# Patient Record
Sex: Female | Born: 1985 | Race: Black or African American | Hispanic: No | Marital: Single | Smoking: Current every day smoker
Health system: Southern US, Community
[De-identification: ages and names within clinical notes are randomized; demographics above are authoritative.]

## PROBLEM LIST (undated history)

## (undated) DIAGNOSIS — R569 Unspecified convulsions: Secondary | ICD-10-CM

## (undated) HISTORY — PX: EXTERNAL EAR SURGERY: SHX627

---

## 2007-09-28 ENCOUNTER — Emergency Department: Payer: Self-pay | Admitting: Emergency Medicine

## 2009-11-09 ENCOUNTER — Emergency Department: Payer: Self-pay | Admitting: Emergency Medicine

## 2012-04-10 ENCOUNTER — Emergency Department: Payer: Self-pay | Admitting: Emergency Medicine

## 2012-04-10 LAB — CBC
HGB: 14.8 g/dL (ref 12.0–16.0)
MCH: 30 pg (ref 26.0–34.0)
MCHC: 34.7 g/dL (ref 32.0–36.0)
MCV: 87 fL (ref 80–100)
Platelet: 259 10*3/uL (ref 150–440)
RBC: 4.93 10*6/uL (ref 3.80–5.20)

## 2012-04-10 LAB — COMPREHENSIVE METABOLIC PANEL
BUN: 10 mg/dL (ref 7–18)
Bilirubin,Total: 0.2 mg/dL (ref 0.2–1.0)
Calcium, Total: 8.6 mg/dL (ref 8.5–10.1)
Chloride: 110 mmol/L — ABNORMAL HIGH (ref 98–107)
Creatinine: 0.74 mg/dL (ref 0.60–1.30)
EGFR (African American): >60
EGFR (Non-African Amer.): >60
SGOT(AST): 17 U/L (ref 15–37)
SGPT (ALT): 18 U/L (ref 12–78)
Total Protein: 7.4 g/dL (ref 6.4–8.2)

## 2012-04-12 LAB — BETA STREP CULTURE(ARMC)

## 2012-11-27 ENCOUNTER — Emergency Department: Payer: Self-pay | Admitting: Emergency Medicine

## 2012-11-27 LAB — COMPREHENSIVE METABOLIC PANEL
Alkaline Phosphatase: 66 U/L (ref 50–136)
Anion Gap: 3 — ABNORMAL LOW (ref 7–16)
BUN: 6 mg/dL — ABNORMAL LOW (ref 7–18)
Bilirubin,Total: 0.2 mg/dL (ref 0.2–1.0)
Co2: 29 mmol/L (ref 21–32)
EGFR (African American): >60
Glucose: 104 mg/dL — ABNORMAL HIGH (ref 65–99)
Osmolality: 274 (ref 275–301)
SGOT(AST): 28 U/L (ref 15–37)
SGPT (ALT): 19 U/L (ref 12–78)
Sodium: 138 mmol/L (ref 136–145)
Total Protein: 8.3 g/dL — ABNORMAL HIGH (ref 6.4–8.2)

## 2012-11-27 LAB — ETHANOL: Ethanol %: 0.143 % — ABNORMAL HIGH (ref 0.000–0.080)

## 2012-11-27 LAB — CBC
HCT: 44.1 % (ref 35.0–47.0)
HGB: 15.1 g/dL (ref 12.0–16.0)
MCHC: 34.3 g/dL (ref 32.0–36.0)
MCV: 87 fL (ref 80–100)
Platelet: 239 10*3/uL (ref 150–440)
RDW: 13.1 % (ref 11.5–14.5)

## 2013-08-14 ENCOUNTER — Emergency Department: Payer: Self-pay | Admitting: Emergency Medicine

## 2014-02-28 ENCOUNTER — Emergency Department: Payer: Self-pay | Admitting: Emergency Medicine

## 2014-08-09 ENCOUNTER — Emergency Department: Payer: Self-pay | Admitting: Emergency Medicine

## 2015-07-25 ENCOUNTER — Emergency Department
Admission: EM | Admit: 2015-07-25 | Discharge: 2015-07-25 | Disposition: A | Discharge status: Home / Self Care | Payer: Self-pay | Attending: Emergency Medicine | Admitting: Emergency Medicine

## 2015-07-25 ENCOUNTER — Emergency Department: Payer: Self-pay

## 2015-07-25 ENCOUNTER — Encounter: Payer: Self-pay | Admitting: Emergency Medicine

## 2015-07-25 DIAGNOSIS — R569 Unspecified convulsions: Secondary | ICD-10-CM | POA: Insufficient documentation

## 2015-07-25 DIAGNOSIS — Z3202 Encounter for pregnancy test, result negative: Secondary | ICD-10-CM | POA: Insufficient documentation

## 2015-07-25 DIAGNOSIS — K6289 Other specified diseases of anus and rectum: Secondary | ICD-10-CM | POA: Insufficient documentation

## 2015-07-25 DIAGNOSIS — F1410 Cocaine abuse, uncomplicated: Secondary | ICD-10-CM | POA: Insufficient documentation

## 2015-07-25 DIAGNOSIS — F17210 Nicotine dependence, cigarettes, uncomplicated: Secondary | ICD-10-CM | POA: Insufficient documentation

## 2015-07-25 DIAGNOSIS — F1210 Cannabis abuse, uncomplicated: Secondary | ICD-10-CM | POA: Insufficient documentation

## 2015-07-25 HISTORY — DX: Unspecified convulsions: R56.9

## 2015-07-25 LAB — URINE DRUG SCREEN, QUALITATIVE (ARMC ONLY)
Amphetamines, Ur Screen: NOT DETECTED
BARBITURATES, UR SCREEN: NOT DETECTED
Benzodiazepine, Ur Scrn: NOT DETECTED
CANNABINOID 50 NG, UR ~~LOC~~: POSITIVE — AB
COCAINE METABOLITE, UR ~~LOC~~: POSITIVE — AB
MDMA (Ecstasy)Ur Screen: NOT DETECTED
Methadone Scn, Ur: NOT DETECTED
OPIATE, UR SCREEN: NOT DETECTED
Phencyclidine (PCP) Ur S: NOT DETECTED
TRICYCLIC, UR SCREEN: NOT DETECTED

## 2015-07-25 LAB — CK: Total CK: 208 U/L (ref 38–234)

## 2015-07-25 LAB — URINALYSIS COMPLETE WITH MICROSCOPIC (ARMC ONLY)
BACTERIA UA: NONE SEEN
Bilirubin Urine: NEGATIVE
Glucose, UA: NEGATIVE mg/dL
Hgb urine dipstick: NEGATIVE
Leukocytes, UA: NEGATIVE
Nitrite: NEGATIVE
PH: 7 (ref 5.0–8.0)
PROTEIN: NEGATIVE mg/dL
Specific Gravity, Urine: 1.013 (ref 1.005–1.030)

## 2015-07-25 LAB — COMPREHENSIVE METABOLIC PANEL
ALT: 16 U/L (ref 14–54)
AST: 20 U/L (ref 15–41)
Albumin: 4.9 g/dL (ref 3.5–5.0)
Alkaline Phosphatase: 45 U/L (ref 38–126)
Anion gap: 9 (ref 5–15)
BILIRUBIN TOTAL: 0.4 mg/dL (ref 0.3–1.2)
BUN: 7 mg/dL (ref 6–20)
CO2: 23 mmol/L (ref 22–32)
CREATININE: 0.64 mg/dL (ref 0.44–1.00)
Calcium: 8.7 mg/dL — ABNORMAL LOW (ref 8.9–10.3)
Chloride: 108 mmol/L (ref 101–111)
GLUCOSE: 90 mg/dL (ref 65–99)
POTASSIUM: 3.6 mmol/L (ref 3.5–5.1)
Sodium: 140 mmol/L (ref 135–145)
TOTAL PROTEIN: 7.8 g/dL (ref 6.5–8.1)

## 2015-07-25 LAB — CBC
HEMATOCRIT: 44.5 % (ref 35.0–47.0)
Hemoglobin: 14.9 g/dL (ref 12.0–16.0)
MCH: 29.3 pg (ref 26.0–34.0)
MCHC: 33.5 g/dL (ref 32.0–36.0)
MCV: 87.5 fL (ref 80.0–100.0)
PLATELETS: 215 10*3/uL (ref 150–440)
RBC: 5.09 MIL/uL (ref 3.80–5.20)
RDW: 13.8 % (ref 11.5–14.5)
WBC: 8.9 10*3/uL (ref 3.6–11.0)

## 2015-07-25 LAB — POCT PREGNANCY, URINE: PREG TEST UR: NEGATIVE

## 2015-07-25 LAB — ETHANOL: Alcohol, Ethyl (B): 152 mg/dL — ABNORMAL HIGH (ref <5)

## 2015-07-25 MED ORDER — SODIUM CHLORIDE 0.9 % IV SOLN
1000.0000 mg | Freq: Once | INTRAVENOUS | Status: AC
  Administered 2015-07-25: 1000 mg via INTRAVENOUS
  Filled 2015-07-25 (×2): qty 10

## 2015-07-25 NOTE — ED Notes (Addendum)
Pt presents to ED via EMS after she had reportedly had seizure like activity while at home with her girlfriend and again witnessed by EMS. Pt was said to have smoked marijuana and drank brandy this evening. Seizure lasted unknown amount of time and pt denies any pain or injury. Pt denies incontinence during this time. Pt not currently answering questions verbally and is only raising her hand to give a thumbs up.  Pt currently has no increased work of breathing or acute distress noted at this time. Hx of the same approx a year ago.

## 2015-07-25 NOTE — ED Notes (Signed)
Pt reports she had gone to the bathroom to use the restroom right before her seizure like activity occured. Pt states her rectum has been very painful this evening. Denies rectal bleeding, hemorrhoid, or abscess to the affected area. MD visualized rectum during exam and was unable to find any obvious abnormalities.

## 2015-07-25 NOTE — ED Notes (Signed)
Dr. Webster at the bedside for pt evaluation 

## 2015-07-25 NOTE — ED Provider Notes (Signed)
-----------------------------------------   10:43 AM on 07/25/2015 -----------------------------------------  Patient's imaging of come back showing normal results. Patient's labs show an elevated alcohol level as well as a urine positive for cocaine and cannabinoids. Patient has had no further seizure activity in the emergency department. At this time with a recent substance abuse I do not feel she will require long-term antiepileptic medication urgently/emergently, however the patient will need follow-up with neurology for further evaluation and hopefully an EEG. I discussed this plan of care with the patient and she is agreeable. We will discharge home with neurology follow-up.  Harvest Dark, MD 07/25/15 1044

## 2015-07-25 NOTE — ED Provider Notes (Signed)
[facility] Emergency Department Provider Note  ____________________________________________  Time seen: Approximately 546 AM  I have reviewed the triage vital signs and the nursing notes.   HISTORY  Chief Complaint Seizures  The patient is uncooperative and not answering questions fully.  HPI Kendra Potts is a 29 y.o. female who comes into the hospital with the seizure. The patient has had a seizure 1 year ago. According to EMS the patient was drinking brandy and smoking weed last night at a girlfriend's house. The patient then had a seizure. The patient reports that she does not remember what happened she was just told that she had a seizure. Per EMS it appears as though the seizure lasted 15-20 seconds and the patient had some mild shaking of her hands in her feet. The patient reports the last thing she remembers is seeing strange people but then she reported that the strange people with the EMS workers. She is homeless and reports that she was at a friend's house. She reports that she had used the bathroom earlier and is having some pain to her bottom. She reports that when the ambulance arrived she blacked out but couldn't tell me what happened prior. She reports that she is drowsy.The patient rates her pain 8 out of 10 in intensity at this time and her bottom. She does not have any headaches and does not remember hitting her head. She did have a seizure 1 year ago but states I don't know when asked about the seizure.   Past Medical History  Diagnosis Date  . Seizure ([place])     There are no active problems to display for this patient.   History reviewed. No pertinent past surgical history.  No current outpatient prescriptions on file.  Allergies Review of patient's allergies indicates no known allergies.  No family history on file.  Social History Social History  Substance Use Topics  . Smoking status: Current Every Day Smoker    Types:  Cigarettes  . Smokeless tobacco: None  . Alcohol Use: Yes    Review of Systems Constitutional: No fever/chills Eyes: No visual changes. ENT: No sore throat. Cardiovascular: Denies chest pain. Respiratory: Denies shortness of breath. Gastrointestinal: No abdominal pain.  No nausea, no vomiting.  No diarrhea.  No constipation. Genitourinary: Rectal pain, Negative for dysuria. Musculoskeletal: Negative for back pain. Skin: Negative for rash. Neurological: Seizure  10-point ROS otherwise negative.  ____________________________________________   PHYSICAL EXAM:  VITAL SIGNS: ED Triage Vitals  Enc Vitals Group     BP 07/25/15 0555 117/78 mmHg     Pulse Rate 07/25/15 0555 78     Resp 07/25/15 0555 26     Temp 07/25/15 0555 98.3 F (36.8 C)     Temp Source 07/25/15 0555 Oral     SpO2 07/25/15 0555 100 %     Weight 07/25/15 0555 125 lb (56.7 kg)     Height 07/25/15 0555 5\' 2"  (1.575 m)     Head Cir --      Peak Flow --      Pain Score 07/25/15 0607 10     Pain Loc --      Pain Edu? --      Excl. in [place]? --     Constitutional: Alert and oriented. Disheveled appearing and in mild distress. Eyes: Conjunctivae are normal. PERRL. EOMI. Head: Atraumatic. Nose: No congestion/rhinnorhea. Mouth/Throat: Mucous membranes are moist.  Oropharynx non-erythematous. Cardiovascular: Normal rate, regular rhythm. Grossly normal heart sounds.  Good peripheral circulation. Respiratory: Normal respiratory effort.  No retractions. Clear to auscultation bilaterally Gastrointestinal: Soft and nontender. No distention. Positive bowel sounds Rectal: Patient with severe pain touching her gluteus maximus. I was able to look at the patient's rectum and she did not have any appearing tears no bleeding no soft tissue swelling with a concern for abscess. The patient will not allow me to touch her rectum. Musculoskeletal: No lower extremity tenderness nor edema.  No joint effusions. Neurologic:  Normal  speech and language. No gross focal neurologic deficits are appreciated. Cranial nerves II through XII are grossly intact Skin:  Skin is warm, dry and intact.  Psychiatric: Mood and affect are normal.   ____________________________________________   LABS (all labs ordered are listed, but only abnormal results are displayed)  Labs Reviewed  COMPREHENSIVE METABOLIC PANEL - Abnormal; Notable for the following:    Calcium 8.7 (*)    All other components within normal limits  CBC  CK  ETHANOL  URINE DRUG SCREEN, QUALITATIVE ([place])   ____________________________________________  EKG  ED ECG REPORT I, Loney Hering, the attending physician, personally viewed and interpreted this ECG.   Date: 07/25/2015  EKG Time: 554  Rate: 87  Rhythm: normal sinus rhythm  Axis: none  Intervals:none  ST&T Change: none  ____________________________________________  RADIOLOGY  CT head: unremarkable non contrast head CT ____________________________________________   PROCEDURES  Procedure(s) performed: None  Critical Care performed: No  ____________________________________________   INITIAL IMPRESSION / ASSESSMENT AND PLAN / ED COURSE  Pertinent labs & imaging results that were available during my care of the patient were reviewed by me and considered in my medical decision making (see chart for details).  This is a 29 year old female who comes in today with a seizure at home. The patient reports that she had a seizure a year ago but did not give me much information. We'll check some blood work a CT head in the pelvis and reassess the patient.  ----------------------------------------- 7:17 AM on 07/25/2015 -----------------------------------------  The patient's care was signed out to Dr.Paduchowski who will follow up the results of the patient's blood work. The patient will receive a dose of Keppra as well for seizure prophylaxis.   ____________________________________________   FINAL CLINICAL IMPRESSION(S) / ED DIAGNOSES  Final diagnoses:  Seizure ([place])      Loney Hering, MD 07/25/15 [phone]

## 2015-07-25 NOTE — Discharge Instructions (Signed)

## 2016-02-10 ENCOUNTER — Encounter: Payer: Self-pay | Admitting: Emergency Medicine

## 2016-02-10 ENCOUNTER — Emergency Department
Admission: EM | Admit: 2016-02-10 | Discharge: 2016-02-10 | Disposition: A | Discharge status: Home / Self Care | Payer: Self-pay | Attending: Emergency Medicine | Admitting: Emergency Medicine

## 2016-02-10 DIAGNOSIS — F17210 Nicotine dependence, cigarettes, uncomplicated: Secondary | ICD-10-CM | POA: Insufficient documentation

## 2016-02-10 DIAGNOSIS — Z8669 Personal history of other diseases of the nervous system and sense organs: Secondary | ICD-10-CM | POA: Insufficient documentation

## 2016-02-10 DIAGNOSIS — H9202 Otalgia, left ear: Secondary | ICD-10-CM

## 2016-02-10 DIAGNOSIS — H6123 Impacted cerumen, bilateral: Secondary | ICD-10-CM | POA: Insufficient documentation

## 2016-02-10 MED ORDER — HYDROCODONE-ACETAMINOPHEN 5-325 MG PO TABS: 1.0000 | ORAL_TABLET | ORAL | Status: DC | PRN

## 2016-02-10 MED ORDER — HYDROCODONE-ACETAMINOPHEN 5-325 MG PO TABS
1.0000 | ORAL_TABLET | Freq: Once | ORAL | Status: AC
  Administered 2016-02-10: 1 via ORAL
  Filled 2016-02-10: qty 1

## 2016-02-10 MED ORDER — NEOMYCIN-POLYMYXIN-HC 3.5-10000-1 OT SOLN: 3.0000 [drp] | Freq: Four times a day (QID) | OTIC | Status: AC

## 2016-02-10 MED ORDER — AMOXICILLIN 500 MG PO CAPS: 500.0000 mg | ORAL_CAPSULE | Freq: Three times a day (TID) | ORAL | Status: DC

## 2016-02-10 NOTE — ED Notes (Signed)
States she developed pain to left ear about 3 days ago  Unsure of fever but has had chills  States pain is to entire left side of face and into teeth.the patient is very tearful on arrival to ED

## 2016-02-10 NOTE — ED Provider Notes (Signed)
[facility] Emergency Department Provider Note  ____________________________________________  Time seen: Approximately 12:14 PM  I have reviewed the triage vital signs and the nursing notes.   HISTORY  Chief Complaint Otalgia   HPI Kendra Potts is a 30 y.o. female is here with complaint of left ear pain for 3 days. Patient is unaware of any fever or chills. 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 Patient states that at one time she was given drops to place in her ear canals to remove wax however her mother died last year and she does not know what medication or where the papers for that medication are. Patient has not seen an ear specialist since moving here. Currently she is tearful and rates her pain as a 10 over 10.   Past Medical History  Diagnosis Date  . Seizure ([place])     There are no active problems to display for this patient.   History reviewed. No pertinent past surgical history.  Current Outpatient Rx  Name  Route  Sig  Dispense  Refill  . amoxicillin (AMOXIL) 500 MG capsule   Oral   Take 1 capsule (500 mg total) by mouth 3 (three) times daily.   30 capsule   0   . HYDROcodone-acetaminophen (NORCO/VICODIN) 5-325 MG tablet   Oral   Take 1 tablet by mouth every 4 (four) hours as needed for moderate pain.   20 tablet   0   . neomycin-polymyxin-hydrocortisone (CORTISPORIN) otic solution   Left Ear   Place 3 drops into the left ear 4 (four) times daily.   10 mL   0     Allergies Review of patient's allergies indicates no known allergies.  No family history on file.  Social History Social History  Substance Use Topics  . Smoking status: Current Every Day Smoker    Types: Cigarettes  . Smokeless tobacco: None  . Alcohol Use: Yes    Review of Systems Constitutional:  No fever/chills Eyes: No visual changes. ENT: No sore throat.Positive left ear pain. Cardiovascular: Denies chest pain. Respiratory: Denies shortness of breath. Gastrointestinal: No abdominal pain.  No nausea, no vomiting.   Skin: Negative for rash. Neurological: Negative for headaches, focal weakness or numbness.  10-point ROS otherwise negative.  ____________________________________________   PHYSICAL EXAM:  VITAL SIGNS: ED Triage Vitals  Enc Vitals Group     BP 02/10/16 1138 150/96 mmHg     Pulse Rate 02/10/16 1138 67     Resp 02/10/16 1138 20     Temp 02/10/16 1138 98.2 F (36.8 C)     Temp Source 02/10/16 1138 Oral     SpO2 02/10/16 1138 100 %     Weight 02/10/16 1138 125 lb (56.7 kg)     Height 02/10/16 1138 5\' 2"  (1.575 m)     Head Cir --      Peak Flow --      Pain Score 02/10/16 1140 10     Pain Loc --      Pain Edu? --      Excl. in [place]? --     Constitutional: Alert and oriented. Well appearing and in no acute distress. Eyes: Conjunctivae are normal. PERRL. EOMI. Head: Atraumatic. Nose: No congestion/rhinnorhea.  Right EAC is moderately occluded with cerumen and visualization of the TM is limited. On examination  of the left ear there is some chronic deformity externally. Examination of the now it is difficult secondary to patient's pain and canal being tortuous. There is moderate amount of cerumen present along with tenderness. TM is not completely clearly  visible. Mouth/Throat: Mucous membranes are moist.  Oropharynx non-erythematous. Neck: No stridor.   Hematological/Lymphatic/Immunilogical: No cervical lymphadenopathy. Cardiovascular: Normal rate, regular rhythm. Grossly normal heart sounds.  Good peripheral circulation. Respiratory: Normal respiratory effort.  No retractions. Lungs CTAB. Musculoskeletal: Moves upper and lower extremities without any difficulty and normal gait was noted. Neurologic:  Normal speech and language. No gross focal neurologic  deficits are appreciated.  Skin:  Skin is warm, dry and intact. No rash noted. Psychiatric: Mood and affect are normal. Speech and behavior are normal.  ____________________________________________   LABS (all labs ordered are listed, but only abnormal results are displayed)  Labs Reviewed - No data to display  PROCEDURES  Procedure(s) performed: None  Procedures  Critical Care performed: No  ____________________________________________   INITIAL IMPRESSION / ASSESSMENT AND PLAN / ED COURSE  Pertinent labs & imaging results that were available during my care of the patient were reviewed by me and considered in my medical decision making (see chart for details).  Cerumen was not removed secondary to patient's pain intolerance and crying. Patient is familiar with using eardrops to remove the wax therefore she is to pick up some Debrox at the pharmacy. Patient was given a prescription for amoxicillin 500 mg 3 times a day for 10 days, Norco as needed for pain, Cortisporin otic solution to her left ear. Patient is to follow-up with Dr. Kathyrn Sheriff for any continued problems with her ear. ____________________________________________   FINAL CLINICAL IMPRESSION(S) / ED DIAGNOSES  Final diagnoses:  Acute ear pain, left  Excessive cerumen in ear canal, bilateral      NEW MEDICATIONS STARTED DURING THIS VISIT:  Discharge Medication List as of 02/10/2016 12:33 PM    START taking these medications   Details  amoxicillin (AMOXIL) 500 MG capsule Take 1 capsule (500 mg total) by mouth 3 (three) times daily., Starting 02/10/2016, Until Discontinued, Print    HYDROcodone-acetaminophen (NORCO/VICODIN) 5-325 MG tablet Take 1 tablet by mouth every 4 (four) hours as needed for moderate pain., Starting 02/10/2016, Until Discontinued, Print    neomycin-polymyxin-hydrocortisone (CORTISPORIN) otic solution Place 3 drops into the left ear 4 (four) times daily., Starting 02/10/2016, Until Wed 02/20/16,  Print         Note:  This document was prepared using Dragon voice recognition software and may include unintentional dictation errors.    Johnn Hai, PA-C 02/10/16 [place], MD 02/10/16 2055

## 2016-02-10 NOTE — ED Notes (Signed)
Pt report left ear pain for past 3 days denies fever

## 2016-02-10 NOTE — Discharge Instructions (Signed)
Begin taking Norco as needed for pain. Amoxicillin 500 mg 3 times a day for 10 days. Cortisporin otic suspension to the left ear as directed. Follow-up with Dr. Kathyrn Sheriff if any continued problems. You will need to call for an appointment. Also obtain Debrox ear wax removal drops to apply to both your ears as directed.  This medication is over-the-counter.

## 2018-02-14 ENCOUNTER — Other Ambulatory Visit: Payer: Self-pay

## 2018-02-14 ENCOUNTER — Emergency Department (HOSPITAL_COMMUNITY)
Admission: EM | Admit: 2018-02-14 | Discharge: 2018-02-15 | Disposition: A | Discharge status: Home / Self Care | Payer: Self-pay | Attending: Emergency Medicine | Admitting: Emergency Medicine

## 2018-02-14 ENCOUNTER — Encounter (HOSPITAL_COMMUNITY): Payer: Self-pay

## 2018-02-14 DIAGNOSIS — Z79899 Other long term (current) drug therapy: Secondary | ICD-10-CM | POA: Insufficient documentation

## 2018-02-14 DIAGNOSIS — M545 Low back pain: Secondary | ICD-10-CM | POA: Insufficient documentation

## 2018-02-14 DIAGNOSIS — F17210 Nicotine dependence, cigarettes, uncomplicated: Secondary | ICD-10-CM | POA: Insufficient documentation

## 2018-02-14 MED ORDER — LIDOCAINE 5 % EX PTCH
1.0000 | MEDICATED_PATCH | CUTANEOUS | Status: DC
  Administered 2018-02-14: 1 via TRANSDERMAL
  Filled 2018-02-14: qty 1

## 2018-02-14 MED ORDER — DEXAMETHASONE SODIUM PHOSPHATE 10 MG/ML IJ SOLN
10.0000 mg | Freq: Once | INTRAMUSCULAR | Status: AC
  Administered 2018-02-14: 10 mg via INTRAMUSCULAR
  Filled 2018-02-14: qty 1

## 2018-02-14 MED ORDER — OXYCODONE-ACETAMINOPHEN 5-325 MG PO TABS
2.0000 | ORAL_TABLET | Freq: Once | ORAL | Status: AC
  Administered 2018-02-14: 2 via ORAL
  Filled 2018-02-14: qty 2

## 2018-02-14 MED ORDER — METHOCARBAMOL 500 MG PO TABS
500.0000 mg | ORAL_TABLET | Freq: Once | ORAL | Status: AC
  Administered 2018-02-14: 500 mg via ORAL
  Filled 2018-02-14: qty 1

## 2018-02-14 NOTE — ED Triage Notes (Signed)
Pt reports a R lower back spasm that started around 2p today, Pt states that she was "horseplaying" when it started. Denies urinary symptoms or N/V/D.   Pt also reports that she got some Popeye's seasoning up her nose 2 weeks ago and wants her nose evaluated.    A&Ox4. Ambulatory, but slow and stiiff.

## 2018-02-14 NOTE — ED Provider Notes (Signed)
[facility] DEPT Provider Note   CSN: [id number] Arrival date & time: 02/14/18  2212     History   Chief Complaint Chief Complaint  Patient presents with  . Back Pain    spasms    HPI Kendra Potts is a 32 y.o. female.  32 year old female with a history of chronic back pain presents to the emergency department for evaluation of acute exacerbation of back pain.  She states that she was "horse playing" at 1400 today when she twisted and felt a "pop" in her low back followed by onset of worsening pain.  She states that pain is aggravated with movement as well as ambulation.  She took 1200 mg ibuprofen prior to arrival.  This provided no relief of her discomfort.  Denies any bowel or bladder incontinence, genital numbness, lower extremity numbness or paresthesias, extremity weakness.  Denies any history of chronic steroid use or cancer.  No recent fevers.  Patient also secondary complaint of nasal irritation after inhaling some Popeyes seasoning 2 weeks ago.     Past Medical History:  Diagnosis Date  . Seizure ([place])     There are no active problems to display for this patient.   History reviewed. No pertinent surgical history.   OB History   None      Home Medications    Prior to Admission medications   Medication Sig Start Date End Date Taking? Authorizing Provider  amoxicillin (AMOXIL) 500 MG capsule Take 1 capsule (500 mg total) by mouth 3 (three) times daily. 02/10/16   Johnn Hai, PA-C  HYDROcodone-acetaminophen (NORCO/VICODIN) 5-325 MG tablet Take 1-2 tablets by mouth every 6 (six) hours as needed for moderate pain or severe pain. 02/15/18   Antonietta Breach, PA-C  methocarbamol (ROBAXIN) 500 MG tablet Take 1 tablet (500 mg total) by mouth 2 (two) times daily. 02/15/18   Antonietta Breach, PA-C  naproxen (NAPROSYN) 500 MG tablet Take 1 tablet (500 mg total) by mouth 2 (two) times daily. 02/15/18   Antonietta Breach, PA-C    Family  History History reviewed. No pertinent family history.  Social History Social History   Tobacco Use  . Smoking status: Current Every Day Smoker    Types: Cigarettes  Substance Use Topics  . Alcohol use: Yes  . Drug use: No    Types: Marijuana     Allergies   Patient has no known allergies.   Review of Systems Review of Systems Ten systems reviewed and are negative for acute change, except as noted in the HPI.    Physical Exam Updated Vital Signs BP (!) 143/85 (BP Location: Left Arm)   Pulse 90   Temp 98.8 F (37.1 C) (Oral)   Resp 16   SpO2 100%   Physical Exam  Constitutional: She is oriented to person, place, and time. She appears well-developed and well-nourished. No distress.  Nontoxic appearing and in NAD  HENT:  Head: Normocephalic and atraumatic.  Bilateral nares patent. No septal deviation or hematoma.  Eyes: Conjunctivae and EOM are normal. No scleral icterus.  Neck: Normal range of motion.  Cardiovascular: Normal rate, regular rhythm and intact distal pulses.  DP pulse 2+ bilaterally.  Pulmonary/Chest: Effort normal. No stridor. No respiratory distress.  Respirations even and unlabored  Musculoskeletal: Normal range of motion.  Tenderness to palpation across the low back.  No bony deformities, step-offs, crepitus to the thoracic or lumbosacral midline.  Neurological: She is alert and oriented to person, place, and time. She  exhibits normal muscle tone. Coordination normal.  Ambulatory with steady, antalgic gait.  Sensation to light touch intact in bilateral lower extremities.  Skin: Skin is warm and dry. No rash noted. She is not diaphoretic. No erythema. No pallor.  Psychiatric: She has a normal mood and affect. Her behavior is normal.  Nursing note and vitals reviewed.    ED Treatments / Results  Labs (all labs ordered are listed, but only abnormal results are displayed) Labs Reviewed - No data to display  EKG None  Radiology No results  found.  Procedures Procedures (including critical care time)  Medications Ordered in ED Medications  lidocaine (LIDODERM) 5 % 1 patch (1 patch Transdermal Patch Applied 02/14/18 2321)  dexamethasone (DECADRON) injection 10 mg (10 mg Intramuscular Given 02/14/18 2321)  oxyCODONE-acetaminophen (PERCOCET/ROXICET) 5-325 MG per tablet 2 tablet (2 tablets Oral Given 02/14/18 2320)  methocarbamol (ROBAXIN) tablet 500 mg (500 mg Oral Given 02/14/18 2321)    12:38 AM Patient reassessed.  Resting comfortably.  Reports improvement to pain with supportive measures.  Expresses comfort with discharge.   Initial Impression / Assessment and Plan / ED Course  I have reviewed the triage vital signs and the nursing notes.  Pertinent labs & imaging results that were available during my care of the patient were reviewed by me and considered in my medical decision making (see chart for details).     Patient with back pain.  Hx of chronic back pain, reports acute exacerbation onset today.   She is neurovascularly intact on exam.  Patient can walk but states is painful.  No loss of bowel or bladder control.  No concern for cauda equina.  No fever, night sweats, weight loss, h/o cancer.  RICE protocol and pain medicine indicated and discussed with patient.  Return precautions discussed and provided. Patient discharged in stable condition with no unaddressed concerns.   Final Clinical Impressions(s) / ED Diagnoses   Final diagnoses:  Acute bilateral low back pain, with sciatica presence unspecified    ED Discharge Orders        Ordered    naproxen (NAPROSYN) 500 MG tablet  2 times daily     02/15/18 0032    methocarbamol (ROBAXIN) 500 MG tablet  2 times daily     02/15/18 0032    HYDROcodone-acetaminophen (NORCO/VICODIN) 5-325 MG tablet  Every 6 hours PRN     02/15/18 0032       Antonietta Breach, PA-C 02/15/18 0040    Valarie Merino, MD 02/15/18 2303

## 2018-02-15 MED ORDER — METHOCARBAMOL 500 MG PO TABS: 500.0000 mg | ORAL_TABLET | Freq: Two times a day (BID) | ORAL | 0 refills | Status: DC

## 2018-02-15 MED ORDER — HYDROCODONE-ACETAMINOPHEN 5-325 MG PO TABS: 1.0000 | ORAL_TABLET | Freq: Four times a day (QID) | ORAL | 0 refills | Status: DC | PRN

## 2018-02-15 MED ORDER — NAPROXEN 500 MG PO TABS: 500.0000 mg | ORAL_TABLET | Freq: Two times a day (BID) | ORAL | 0 refills | Status: DC

## 2018-02-15 NOTE — Discharge Instructions (Signed)
Alternate ice and heat to areas of injury 3-4 times per day to limit inflammation and spasm.  Avoid strenuous activity and heavy lifting.  We recommend consistent use of naproxen in addition to Robaxin for muscle spasms.  You have been prescribed Norco to take as needed for severe pain.  Do not drive or drink alcohol after taking this medication as it may make you drowsy and impair your judgment.  We recommend follow-up with a primary care doctor to ensure resolution of symptoms.  Return to the ED for any new or concerning symptoms. °

## 2018-05-04 ENCOUNTER — Encounter (HOSPITAL_COMMUNITY): Payer: Self-pay

## 2018-05-04 ENCOUNTER — Other Ambulatory Visit: Payer: Self-pay

## 2018-05-04 ENCOUNTER — Emergency Department (HOSPITAL_COMMUNITY)
Admission: EM | Admit: 2018-05-04 | Discharge: 2018-05-04 | Disposition: A | Discharge status: Home / Self Care | Payer: Self-pay | Attending: Emergency Medicine | Admitting: Emergency Medicine

## 2018-05-04 DIAGNOSIS — F17210 Nicotine dependence, cigarettes, uncomplicated: Secondary | ICD-10-CM | POA: Insufficient documentation

## 2018-05-04 DIAGNOSIS — K047 Periapical abscess without sinus: Secondary | ICD-10-CM | POA: Insufficient documentation

## 2018-05-04 MED ORDER — AMOXICILLIN-POT CLAVULANATE 875-125 MG PO TABS
1.0000 | ORAL_TABLET | Freq: Once | ORAL | Status: AC
  Administered 2018-05-04: 1 via ORAL
  Filled 2018-05-04: qty 1

## 2018-05-04 MED ORDER — NAPROXEN 500 MG PO TABS: 500.0000 mg | ORAL_TABLET | Freq: Two times a day (BID) | ORAL | 0 refills | Status: DC

## 2018-05-04 MED ORDER — AMOXICILLIN-POT CLAVULANATE 875-125 MG PO TABS: 1.0000 | ORAL_TABLET | Freq: Two times a day (BID) | ORAL | 0 refills | Status: DC

## 2018-05-04 MED ORDER — OXYCODONE-ACETAMINOPHEN 5-325 MG PO TABS: 1.0000 | ORAL_TABLET | Freq: Three times a day (TID) | ORAL | 0 refills | Status: DC | PRN

## 2018-05-04 MED ORDER — OXYCODONE-ACETAMINOPHEN 5-325 MG PO TABS
1.0000 | ORAL_TABLET | Freq: Once | ORAL | Status: AC
  Administered 2018-05-04: 1 via ORAL
  Filled 2018-05-04: qty 1

## 2018-05-04 NOTE — ED Triage Notes (Signed)
Patient c/o left lower dental pain x 2 weeks. Patient states left facial swelling just began today.

## 2018-05-04 NOTE — ED Provider Notes (Addendum)
[facility] DEPT Provider Note   CSN: [id number] Arrival date & time: 05/04/18  1309     History   Chief Complaint Chief Complaint  Patient presents with  . Dental Pain    HPI Kendra Potts is a 32 y.o. female who presents to ED for evaluation of 2-week history of gradually worsening left lower dental pain.  No improvement with topical Orajel.  Pain is worse with eating, speaking.  Has not seen a dentist in several years.  Denies any neck pain, drooling, trismus, fever, facial swelling, drainage from site.  HPI  Past Medical History:  Diagnosis Date  . Seizure ([place])     There are no active problems to display for this patient.   Past Surgical History:  Procedure Laterality Date  . EXTERNAL EAR SURGERY       OB History   None      Home Medications    Prior to Admission medications   Medication Sig Start Date End Date Taking? Authorizing Provider  amoxicillin (AMOXIL) 500 MG capsule Take 1 capsule (500 mg total) by mouth 3 (three) times daily. 02/10/16   Johnn Hai, PA-C  amoxicillin-clavulanate (AUGMENTIN) 875-125 MG tablet Take 1 tablet by mouth every 12 (twelve) hours. 05/04/18   Gentry Pilson, PA-C  HYDROcodone-acetaminophen (NORCO/VICODIN) 5-325 MG tablet Take 1-2 tablets by mouth every 6 (six) hours as needed for moderate pain or severe pain. 02/15/18   Antonietta Breach, PA-C  methocarbamol (ROBAXIN) 500 MG tablet Take 1 tablet (500 mg total) by mouth 2 (two) times daily. 02/15/18   Antonietta Breach, PA-C  naproxen (NAPROSYN) 500 MG tablet Take 1 tablet (500 mg total) by mouth 2 (two) times daily. 05/04/18   Ashling Roane, PA-C  oxyCODONE-acetaminophen (PERCOCET/ROXICET) 5-325 MG tablet Take 1 tablet by mouth every 8 (eight) hours as needed for severe pain. 05/04/18   Delia Heady, PA-C    Family History No family history on file.  Social History Social History   Tobacco Use  . Smoking status: Current Every Day Smoker    Types:  Cigarettes  Substance Use Topics  . Alcohol use: Yes  . Drug use: No    Types: Marijuana     Allergies   Patient has no known allergies.   Review of Systems Review of Systems  Constitutional: Negative for chills and fever.  HENT: Positive for dental problem. Negative for facial swelling.   Musculoskeletal: Negative for neck pain.     Physical Exam Updated Vital Signs BP 115/87   Pulse 79   Temp 98.1 F (36.7 C) (Oral)   Resp 16   Ht 5\' 2"  (1.575 m)   Wt 68 kg   LMP 05/04/2018   SpO2 99%   BMI 27.44 kg/m   Physical Exam  Constitutional: She appears well-developed and well-nourished. No distress.  HENT:  Head: Normocephalic and atraumatic.  Mouth/Throat: Uvula is midline and oropharynx is clear and moist. She does not have dentures. No oral lesions. No trismus in the jaw. Abnormal dentition. Dental caries present. No dental abscesses, uvula swelling or lacerations.    Overall poor dentition with several decaying teeth noted.  Decay noted of the indicated tooth with tenderness palpation.  No gross dental abscess or site of drainage at this time. No facial, neck or cheek swelling noted. No pooling of secretions or trismus.  Normal voice noted with no difficulty swallowing or breathing.  No submandibular erythema, edema or crepitus noted.  Eyes: Conjunctivae and EOM are  normal. No scleral icterus.  Neck: Normal range of motion.  Pulmonary/Chest: Effort normal. No respiratory distress.  Neurological: She is alert.  Skin: No rash noted. She is not diaphoretic.  Psychiatric: She has a normal mood and affect.  Nursing note and vitals reviewed.    ED Treatments / Results  Labs (all labs ordered are listed, but only abnormal results are displayed) Labs Reviewed - No data to display  EKG None  Radiology No results found.  Procedures Procedures (including critical care time)  Medications Ordered in ED Medications  amoxicillin-clavulanate (AUGMENTIN) 875-125 MG  per tablet 1 tablet (has no administration in time range)     Initial Impression / Assessment and Plan / ED Course  I have reviewed the triage vital signs and the nursing notes.  Pertinent labs & imaging results that were available during my care of the patient were reviewed by me and considered in my medical decision making (see chart for details).     Patient with dentalgia. On exam, there is no evidence of a drainable abscess. No trismus, glossal elevation, unilateral tonsillar swelling. No evidence of retropharyngeal or peritonsillar abscess or Ludwig angina. Will treat with  Augmentin, short course of pain medication anti-inflammatories. Pt instructed to follow-up with dentist as soon as possible. Resource guide provided with AVS. Patient discussed with and seen by my attending, Dr. Kathrynn Humble. [place] PMP reviewed with no discrepancies.  Portions of this note were generated with L[employer]. Dictation errors may occur despite best attempts at proofreading.  Final Clinical Impressions(s) / ED Diagnoses   Final diagnoses:  Periapical abscess    ED Discharge Orders         Ordered    amoxicillin-clavulanate (AUGMENTIN) 875-125 MG tablet  Every 12 hours     05/04/18 1503    oxyCODONE-acetaminophen (PERCOCET/ROXICET) 5-325 MG tablet  Every 8 hours PRN     05/04/18 1503    naproxen (NAPROSYN) 500 MG tablet  2 times daily     05/04/18 1503               Delia Heady, PA-C 05/04/18 1508    Varney Biles, MD 05/06/18 1708

## 2018-05-04 NOTE — Discharge Instructions (Signed)
Return to ED for worsening symptoms, increased swelling, drainage from the tooth, trouble swallowing or breathing.

## 2018-07-26 ENCOUNTER — Emergency Department
Admission: EM | Admit: 2018-07-26 | Discharge: 2018-07-26 | Disposition: A | Discharge status: Home / Self Care | Payer: Self-pay | Attending: Emergency Medicine | Admitting: Emergency Medicine

## 2018-07-26 ENCOUNTER — Encounter: Payer: Self-pay | Admitting: Intensive Care

## 2018-07-26 ENCOUNTER — Other Ambulatory Visit: Payer: Self-pay

## 2018-07-26 DIAGNOSIS — K047 Periapical abscess without sinus: Secondary | ICD-10-CM | POA: Insufficient documentation

## 2018-07-26 DIAGNOSIS — F17210 Nicotine dependence, cigarettes, uncomplicated: Secondary | ICD-10-CM | POA: Insufficient documentation

## 2018-07-26 DIAGNOSIS — Z79899 Other long term (current) drug therapy: Secondary | ICD-10-CM | POA: Insufficient documentation

## 2018-07-26 DIAGNOSIS — K0889 Other specified disorders of teeth and supporting structures: Secondary | ICD-10-CM

## 2018-07-26 MED ORDER — KETOROLAC TROMETHAMINE 10 MG PO TABS: 10.0000 mg | ORAL_TABLET | Freq: Four times a day (QID) | ORAL | 0 refills | Status: AC | PRN

## 2018-07-26 MED ORDER — AMOXICILLIN 875 MG PO TABS: 875.0000 mg | ORAL_TABLET | Freq: Two times a day (BID) | ORAL | 0 refills | Status: AC

## 2018-07-26 MED ORDER — KETOROLAC TROMETHAMINE 30 MG/ML IJ SOLN
30.0000 mg | Freq: Once | INTRAMUSCULAR | Status: AC
  Administered 2018-07-26: 30 mg via INTRAMUSCULAR
  Filled 2018-07-26: qty 1

## 2018-07-26 NOTE — ED Triage Notes (Signed)
Patient c/o left sided dental pain that has moved up to left ear X2 days. Hx of same

## 2018-07-26 NOTE — ED Notes (Signed)
Pt c/o left-sided dental pain. Pt states back left lower tooth is infected and she cannot get rid of the infection. Pt stated left ear is also bothering her. Pt states she recently had surgery on this ear, and the doctor told her not to put anything in her ear. Pt states she used q-tip the other day and "thinks she pushed the wax down".

## 2018-07-26 NOTE — ED Notes (Signed)
See triage note  Presents with possible dental abscess and now states pain is moving into right ear

## 2018-07-26 NOTE — ED Provider Notes (Signed)
[facility] Emergency Department Provider Note  ____________________________________________  Time seen: Approximately 6:13 PM  I have reviewed the triage vital signs and the nursing notes.   HISTORY  Chief Complaint Dental Pain    HPI ARDA DAGGS is a 32 y.o. female presents to the emergency department with left-sided dental pain that radiates along her left jaw into her left ear.  Patient reports that she has had symptoms for the past 2 to 3 days.  Patient reports that it hurts to chew on the affected side.  She is not under the care of a dentist due to a lack of dental insurance.  She denies fever and chills at home but reports that it has been difficult for her to open and close her jaw.  Patient has had mild left lower jaw swelling.    Past Medical History:  Diagnosis Date  . Seizure ([place])     There are no active problems to display for this patient.   Past Surgical History:  Procedure Laterality Date  . EXTERNAL EAR SURGERY      Prior to Admission medications   Medication Sig Start Date End Date Taking? Authorizing Provider  amoxicillin (AMOXIL) 875 MG tablet Take 1 tablet (875 mg total) by mouth 2 (two) times daily for 10 days. 07/26/18 08/05/18  Lannie Fields, PA-C  amoxicillin-clavulanate (AUGMENTIN) 875-125 MG tablet Take 1 tablet by mouth every 12 (twelve) hours. 05/04/18   Khatri, Hina, PA-C  HYDROcodone-acetaminophen (NORCO/VICODIN) 5-325 MG tablet Take 1-2 tablets by mouth every 6 (six) hours as needed for moderate pain or severe pain. 02/15/18   Antonietta Breach, PA-C  ketorolac (TORADOL) 10 MG tablet Take 1 tablet (10 mg total) by mouth every 6 (six) hours as needed for up to 5 days. 07/26/18 07/31/18  Lannie Fields, PA-C  methocarbamol (ROBAXIN) 500 MG tablet Take 1 tablet (500 mg total) by mouth 2 (two) times daily. 02/15/18   Antonietta Breach, PA-C  naproxen (NAPROSYN) 500 MG tablet Take 1 tablet (500 mg total) by mouth 2 (two) times daily.  05/04/18   Khatri, Hina, PA-C  oxyCODONE-acetaminophen (PERCOCET/ROXICET) 5-325 MG tablet Take 1 tablet by mouth every 8 (eight) hours as needed for severe pain. 05/04/18   Delia Heady, PA-C    Allergies Patient has no known allergies.  History reviewed. No pertinent family history.  Social History Social History   Tobacco Use  . Smoking status: Current Every Day Smoker    Types: Cigarettes  . Smokeless tobacco: Never Used  Substance Use Topics  . Alcohol use: Yes  . Drug use: Yes    Types: Marijuana     Review of Systems  Constitutional: No fever/chills Eyes: No visual changes. No discharge ENT: Patient has had dental pain and left lower jaw swelling.  Cardiovascular: no chest pain. Respiratory: no cough. No SOB. Gastrointestinal: No abdominal pain.  No nausea, no vomiting.  No diarrhea.  No constipation. Musculoskeletal: Negative for musculoskeletal pain. Skin: Negative for rash, abrasions, lacerations, ecchymosis. Neurological: Negative for headaches, focal weakness or numbness.   ____________________________________________   PHYSICAL EXAM:  VITAL SIGNS: ED Triage Vitals  Enc Vitals Group     BP 07/26/18 1601 131/90     Pulse Rate 07/26/18 1601 70     Resp 07/26/18 1601 14     Temp 07/26/18 1601 98.6 F (37 C)     Temp Source 07/26/18 1601 Oral     SpO2 07/26/18 1605 100 %  Weight 07/26/18 1602 140 lb (63.5 kg)     Height 07/26/18 1602 5\' 2"  (1.575 m)     Head Circumference --      Peak Flow --      Pain Score 07/26/18 1602 8     Pain Loc --      Pain Edu? --      Excl. in [place]? --      Constitutional: Alert and oriented. Well appearing and in no acute distress. Eyes: Conjunctivae are normal. PERRL. EOMI. Head: Atraumatic. ENT:      Ears: Left TM is occluded by wax.      Nose: No congestion/rhinnorhea.      Mouth/Throat: Mucous membranes are moist. Multiple dental carries present. Mild swelling of the left lower jaw visualized. Neck: No stridor.   No cervical spine tenderness to palpation. Cardiovascular: Normal rate, regular rhythm. Normal S1 and S2.  Good peripheral circulation. Respiratory: Normal respiratory effort without tachypnea or retractions. Lungs CTAB. Good air entry to the bases with no decreased or absent breath sounds. Skin:  Skin is warm, dry and intact. No rash noted. Psychiatric: Mood and affect are normal. Speech and behavior are normal. Patient exhibits appropriate insight and judgement.   ____________________________________________   LABS (all labs ordered are listed, but only abnormal results are displayed)  Labs Reviewed - No data to display ____________________________________________  EKG   ____________________________________________  RADIOLOGY   No results found.  ____________________________________________    PROCEDURES  Procedure(s) performed:    Procedures    Medications  ketorolac (TORADOL) 30 MG/ML injection 30 mg (30 mg Intramuscular Given 07/26/18 1738)     ____________________________________________   INITIAL IMPRESSION / ASSESSMENT AND PLAN / ED COURSE  Pertinent labs & imaging results that were available during my care of the patient were reviewed by me and considered in my medical decision making (see chart for details).  Review of the [place] CSRS was performed in accordance of the [place] prior to dispensing any controlled drugs.      Assessment and plan Dental abscess Patient presents to the emergency department with left-sided lower jaw pain and swelling consistent with dental abscess.  Patient was given an injection of Toradol in the emergency department after she denied possibility of pregnancy.  Patient was discharged with Toradol and amoxicillin.  She was advised to make an appointment with a local dentist as soon as possible.  Dental resources were provided in patient's discharge paperwork.    ____________________________________________  FINAL CLINICAL  IMPRESSION(S) / ED DIAGNOSES  Final diagnoses:  Pain, dental  Dental abscess      NEW MEDICATIONS STARTED DURING THIS VISIT:  ED Discharge Orders         Ordered    amoxicillin (AMOXIL) 875 MG tablet  2 times daily     07/26/18 1732    ketorolac (TORADOL) 10 MG tablet  Every 6 hours PRN     07/26/18 1733              This chart was dictated using voice recognition software/Dragon. Despite best efforts to proofread, errors can occur which can change the meaning. Any change was purely unintentional.    Lannie Fields, PA-C 07/26/18 1818    Schuyler Amor, MD 07/26/18 2053

## 2018-08-11 ENCOUNTER — Emergency Department (HOSPITAL_COMMUNITY)
Admission: EM | Admit: 2018-08-11 | Discharge: 2018-08-11 | Disposition: A | Discharge status: Home / Self Care | Payer: Self-pay | Attending: Emergency Medicine | Admitting: Emergency Medicine

## 2018-08-11 ENCOUNTER — Encounter (HOSPITAL_COMMUNITY): Payer: Self-pay | Admitting: *Deleted

## 2018-08-11 ENCOUNTER — Other Ambulatory Visit: Payer: Self-pay

## 2018-08-11 DIAGNOSIS — F10929 Alcohol use, unspecified with intoxication, unspecified: Secondary | ICD-10-CM | POA: Insufficient documentation

## 2018-08-11 DIAGNOSIS — Z5321 Procedure and treatment not carried out due to patient leaving prior to being seen by health care provider: Secondary | ICD-10-CM | POA: Insufficient documentation

## 2018-08-11 NOTE — ED Notes (Signed)
Pt called for vitals reassessment x2 with no answer.

## 2018-08-11 NOTE — ED Notes (Signed)
Pt called x1 for vitals reassessment with no answer.

## 2018-08-11 NOTE — ED Triage Notes (Signed)
Pt bib EMS and presents after drinking "wild irish rose" for the past three days.  Pt has had some depression and family stressor issues.  Pt hx of seizures when pt experiences stressful situations. Pt a/o x 4.

## 2018-11-15 ENCOUNTER — Encounter (HOSPITAL_COMMUNITY): Payer: Self-pay

## 2018-11-15 ENCOUNTER — Other Ambulatory Visit: Payer: Self-pay

## 2018-11-15 ENCOUNTER — Emergency Department (HOSPITAL_COMMUNITY)
Admission: EM | Admit: 2018-11-15 | Discharge: 2018-11-15 | Disposition: A | Discharge status: Home / Self Care | Payer: Self-pay | Attending: Emergency Medicine | Admitting: Emergency Medicine

## 2018-11-15 DIAGNOSIS — F17210 Nicotine dependence, cigarettes, uncomplicated: Secondary | ICD-10-CM | POA: Insufficient documentation

## 2018-11-15 DIAGNOSIS — R05 Cough: Secondary | ICD-10-CM | POA: Insufficient documentation

## 2018-11-15 DIAGNOSIS — J301 Allergic rhinitis due to pollen: Secondary | ICD-10-CM | POA: Insufficient documentation

## 2018-11-15 DIAGNOSIS — F1290 Cannabis use, unspecified, uncomplicated: Secondary | ICD-10-CM | POA: Insufficient documentation

## 2018-11-15 MED ORDER — PREDNISONE 50 MG PO TABS: 50.0000 mg | ORAL_TABLET | Freq: Every day | ORAL | 0 refills | Status: DC

## 2018-11-15 MED ORDER — PROMETHAZINE-DM 6.25-15 MG/5ML PO SYRP: 5.0000 mL | ORAL_SOLUTION | Freq: Four times a day (QID) | ORAL | 0 refills | Status: DC | PRN

## 2018-11-15 MED ORDER — BUDESONIDE 32 MCG/ACT NA SUSP: 2.0000 | Freq: Every day | NASAL | 0 refills | Status: DC

## 2018-11-15 MED ORDER — CETIRIZINE-PSEUDOEPHEDRINE ER 5-120 MG PO TB12: 1.0000 | ORAL_TABLET | Freq: Every day | ORAL | 0 refills | Status: DC

## 2018-11-15 NOTE — Discharge Instructions (Addendum)
Return here as needed.  Follow-up with a primary doctor.  Increase your fluid intake and rest as much as possible.

## 2018-11-15 NOTE — ED Provider Notes (Signed)
[facility] EMERGENCY DEPARTMENT Provider Note   CSN: [id number] Arrival date & time: 11/15/18  2003    History   Chief Complaint Chief Complaint  Patient presents with  . Recurrent Sinusitis    HPI Kendra Potts is a 33 y.o. female.     HPI Patient presents to the emergency department with nasal congestion and drainage over the last week.  The patient states she has had sneezing and coughing.  The patient states that she normally gets allergies this time of year and this feels similar to her previous.  The patient states that she went to work today and was told to not come back until she had a note to return to work.  She states that she is not had any fevers nausea, vomiting, weakness, dizziness, shortness of breath, diarrhea, near-syncope or syncope. Past Medical History:  Diagnosis Date  . Seizure ([place])     There are no active problems to display for this patient.   Past Surgical History:  Procedure Laterality Date  . EXTERNAL EAR SURGERY       OB History   No obstetric history on file.      Home Medications    Prior to Admission medications   Medication Sig Start Date End Date Taking? Authorizing Provider  amoxicillin-clavulanate (AUGMENTIN) 875-125 MG tablet Take 1 tablet by mouth every 12 (twelve) hours. Patient not taking: Reported on 08/11/2018 05/04/18   Delia Heady, PA-C  HYDROcodone-acetaminophen (NORCO/VICODIN) 5-325 MG tablet Take 1-2 tablets by mouth every 6 (six) hours as needed for moderate pain or severe pain. Patient not taking: Reported on 08/11/2018 02/15/18   Antonietta Breach, PA-C  methocarbamol (ROBAXIN) 500 MG tablet Take 1 tablet (500 mg total) by mouth 2 (two) times daily. Patient not taking: Reported on 08/11/2018 02/15/18   Antonietta Breach, PA-C  naproxen (NAPROSYN) 500 MG tablet Take 1 tablet (500 mg total) by mouth 2 (two) times daily. Patient not taking: Reported on 08/11/2018 05/04/18   Delia Heady, PA-C   oxyCODONE-acetaminophen (PERCOCET/ROXICET) 5-325 MG tablet Take 1 tablet by mouth every 8 (eight) hours as needed for severe pain. Patient not taking: Reported on 08/11/2018 05/04/18   Delia Heady, PA-C    Family History No family history on file.  Social History Social History   Tobacco Use  . Smoking status: Current Every Day Smoker    Types: Cigarettes  . Smokeless tobacco: Never Used  Substance Use Topics  . Alcohol use: Yes  . Drug use: Yes    Types: Marijuana     Allergies   Patient has no known allergies.   Review of Systems Review of Systems  All other systems negative except as documented in the HPI. All pertinent positives and negatives as reviewed in the HPI. Physical Exam Updated Vital Signs BP (!) 128/93 (BP Location: Right Arm)   Pulse 88   Temp 98.2 F (36.8 C) (Oral)   Resp 16   LMP 11/14/2018   SpO2 100%   Physical Exam Vitals signs and nursing note reviewed.  Constitutional:      General: She is not in acute distress.    Appearance: She is well-developed.  HENT:     Head: Normocephalic and atraumatic.     Right Ear: Tympanic membrane normal.     Left Ear: Tympanic membrane normal.     Nose: Mucosal edema, congestion and rhinorrhea present.     Right Nostril: No epistaxis.     Left Nostril: No epistaxis.  Right Turbinates: Enlarged.     Left Turbinates: Enlarged.     Right Sinus: No maxillary sinus tenderness or frontal sinus tenderness.     Left Sinus: No maxillary sinus tenderness or frontal sinus tenderness.  Eyes:     Pupils: Pupils are equal, round, and reactive to light.  Neck:     Musculoskeletal: Normal range of motion and neck supple.  Cardiovascular:     Rate and Rhythm: Normal rate and regular rhythm.     Heart sounds: Normal heart sounds. No murmur. No friction rub. No gallop.   Pulmonary:     Effort: Pulmonary effort is normal. No respiratory distress.     Breath sounds: Normal breath sounds. No wheezing.  Skin:     General: Skin is warm and dry.     Capillary Refill: Capillary refill takes less than 2 seconds.     Findings: No erythema or rash.  Neurological:     Mental Status: She is alert and oriented to person, place, and time.     Motor: No abnormal muscle tone.     Coordination: Coordination normal.  Psychiatric:        Behavior: Behavior normal.      ED Treatments / Results  Labs (all labs ordered are listed, but only abnormal results are displayed) Labs Reviewed - No data to display  EKG None  Radiology No results found.  Procedures Procedures (including critical care time)  Medications Ordered in ED Medications - No data to display   Initial Impression / Assessment and Plan / ED Course  I have reviewed the triage vital signs and the nursing notes.  Pertinent labs & imaging results that were available during my care of the patient were reviewed by me and considered in my medical decision making (see chart for details).        Patient be treated for her allergic rhinitis.  Have advised her to return here as needed.  Told to increase her fluid intake and rest as much as possible.  I feel that this is probably not Covid related based on the fact that she is had these symptoms previously in the past along with my there is no fever shortness of breath or other symptoms at this time.  Final Clinical Impressions(s) / ED Diagnoses   Final diagnoses:  None    ED Discharge Orders    None       Dalia Heading, Hershal Coria 11/15/18 2119    Veryl Speak, MD 11/16/18 [phone]

## 2018-11-15 NOTE — ED Notes (Signed)
Patient verbalizes understanding of discharge instructions. Opportunity for questioning and answers were provided. Armband removed by staff, pt discharged from ED.  

## 2018-11-15 NOTE — ED Triage Notes (Signed)
Pt arrived with c/o sinus problems for over a week; pt reports sneezing and coughing and was told could not report back to work w/o a work note due to coronavirus; Pt a&ox 4 on arrival and denies fevers or being around anyone sick-Monique,RN

## 2019-04-03 ENCOUNTER — Encounter: Payer: Self-pay | Admitting: Emergency Medicine

## 2019-04-03 ENCOUNTER — Other Ambulatory Visit: Payer: Self-pay

## 2019-04-03 ENCOUNTER — Emergency Department: Payer: No Typology Code available for payment source

## 2019-04-03 ENCOUNTER — Emergency Department
Admission: EM | Admit: 2019-04-03 | Discharge: 2019-04-03 | Disposition: A | Discharge status: Home / Self Care | Payer: No Typology Code available for payment source | Attending: Emergency Medicine | Admitting: Emergency Medicine

## 2019-04-03 DIAGNOSIS — D259 Leiomyoma of uterus, unspecified: Secondary | ICD-10-CM | POA: Diagnosis not present

## 2019-04-03 DIAGNOSIS — Y939 Activity, unspecified: Secondary | ICD-10-CM | POA: Insufficient documentation

## 2019-04-03 DIAGNOSIS — Y999 Unspecified external cause status: Secondary | ICD-10-CM | POA: Diagnosis not present

## 2019-04-03 DIAGNOSIS — N9489 Other specified conditions associated with female genital organs and menstrual cycle: Secondary | ICD-10-CM

## 2019-04-03 DIAGNOSIS — S32048A Other fracture of fourth lumbar vertebra, initial encounter for closed fracture: Secondary | ICD-10-CM

## 2019-04-03 DIAGNOSIS — Y929 Unspecified place or not applicable: Secondary | ICD-10-CM | POA: Diagnosis not present

## 2019-04-03 DIAGNOSIS — R569 Unspecified convulsions: Secondary | ICD-10-CM | POA: Diagnosis not present

## 2019-04-03 DIAGNOSIS — F1010 Alcohol abuse, uncomplicated: Secondary | ICD-10-CM

## 2019-04-03 DIAGNOSIS — S22048A Other fracture of fourth thoracic vertebra, initial encounter for closed fracture: Secondary | ICD-10-CM | POA: Diagnosis not present

## 2019-04-03 DIAGNOSIS — R51 Headache: Secondary | ICD-10-CM | POA: Diagnosis present

## 2019-04-03 DIAGNOSIS — F17210 Nicotine dependence, cigarettes, uncomplicated: Secondary | ICD-10-CM | POA: Diagnosis not present

## 2019-04-03 LAB — BASIC METABOLIC PANEL
Anion gap: 11 (ref 5–15)
BUN: 7 mg/dL (ref 6–20)
CO2: 26 mmol/L (ref 22–32)
Calcium: 8.7 mg/dL — ABNORMAL LOW (ref 8.9–10.3)
Chloride: 104 mmol/L (ref 98–111)
Creatinine, Ser: 0.61 mg/dL (ref 0.44–1.00)
GFR calc Af Amer: >60 mL/min (ref >60)
GFR calc non Af Amer: >60 mL/min (ref >60)
Glucose, Bld: 86 mg/dL (ref 70–99)
Potassium: 3.2 mmol/L — ABNORMAL LOW (ref 3.5–5.1)
Sodium: 141 mmol/L (ref 135–145)

## 2019-04-03 LAB — ETHANOL: Alcohol, Ethyl (B): 257 mg/dL — ABNORMAL HIGH (ref <10)

## 2019-04-03 LAB — CBC
HCT: 43.7 % (ref 36.0–46.0)
Hemoglobin: 14.8 g/dL (ref 12.0–15.0)
MCH: 29.1 pg (ref 26.0–34.0)
MCHC: 33.9 g/dL (ref 30.0–36.0)
MCV: 86 fL (ref 80.0–100.0)
Platelets: 318 10*3/uL (ref 150–400)
RBC: 5.08 MIL/uL (ref 3.87–5.11)
RDW: 12.1 % (ref 11.5–15.5)
WBC: 5.3 10*3/uL (ref 4.0–10.5)
nRBC: 0 % (ref 0.0–0.2)

## 2019-04-03 MED ORDER — POTASSIUM CHLORIDE CRYS ER 20 MEQ PO TBCR
40.0000 meq | EXTENDED_RELEASE_TABLET | Freq: Once | ORAL | Status: AC
  Administered 2019-04-03: 40 meq via ORAL
  Filled 2019-04-03: qty 2

## 2019-04-03 MED ORDER — SODIUM CHLORIDE 0.9 % IV BOLUS
1000.0000 mL | Freq: Once | INTRAVENOUS | Status: AC
  Administered 2019-04-03: 12:00:00 1000 mL via INTRAVENOUS

## 2019-04-03 MED ORDER — IOHEXOL 300 MG/ML  SOLN
100.0000 mL | Freq: Once | INTRAMUSCULAR | Status: AC | PRN
  Administered 2019-04-03: 100 mL via INTRAVENOUS

## 2019-04-03 MED ORDER — IBUPROFEN 600 MG PO TABS
600.0000 mg | ORAL_TABLET | ORAL | Status: AC
  Administered 2019-04-03: 08:00:00 600 mg via ORAL
  Filled 2019-04-03: qty 1

## 2019-04-03 NOTE — ED Notes (Signed)
Pt ambulatory in hallway with assistance.  C/o RUQ abdominal pain with ambulation and movement.  Dr. Jacqualine Code aware, will order CT scan for further evaluation.
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
## 2019-04-03 NOTE — Discharge Instructions (Addendum)
No driving today.  Do not drive while utilizing alcohol. Follow-up with orthopedics regarding your back injury (a small fracture of your 4th lumbar vertebrae)  As we discussed, you have an area near your uterus that of appears to be a small "mass" and you need close follow-up.  Please set up a follow-up with obstetrics and gynecology, such as [facility], for close follow-up and further evaluation.  Return the emergency room right away if you develop numbness, weakness, nausea vomiting, headache, severe abdominal pain, chest pain, failure to pass out or other new concerns arise.

## 2019-04-03 NOTE — ED Notes (Signed)
Pt resting comfortably at this time.

## 2019-04-03 NOTE — ED Notes (Signed)
Pt refusing vital signs at time of D/C. Pt in NAD. Respirations even and unlabored. Pt with steady gait to ER lobby. Pt verbalized understanding of D/C instructions and follow up care no further questions at this time.

## 2019-10-16 ENCOUNTER — Other Ambulatory Visit: Payer: Self-pay

## 2019-10-16 ENCOUNTER — Emergency Department
Admission: EM | Admit: 2019-10-16 | Discharge: 2019-10-16 | Disposition: A | Discharge status: Home / Self Care | Payer: Self-pay | Attending: Emergency Medicine | Admitting: Emergency Medicine

## 2019-10-16 DIAGNOSIS — J029 Acute pharyngitis, unspecified: Secondary | ICD-10-CM | POA: Insufficient documentation

## 2019-10-16 DIAGNOSIS — F17210 Nicotine dependence, cigarettes, uncomplicated: Secondary | ICD-10-CM | POA: Insufficient documentation

## 2019-10-16 LAB — GROUP A STREP BY PCR: Group A Strep by PCR: NOT DETECTED

## 2019-10-16 NOTE — Discharge Instructions (Signed)
Take Tylenol and ibuprofen alternating for pharyngitis. 

## 2019-10-16 NOTE — ED Notes (Signed)
Pt refusing COVID swab at this time, states she does not have COVID, believes she has strep throat. Kennyth Lose PA notified.

## 2019-10-16 NOTE — ED Notes (Signed)
Pt given ice water as requested °

## 2019-10-16 NOTE — ED Triage Notes (Addendum)
Pt c/o sore throat since yesterday. A&O, ambulatory. Thinks she has strep throat. Sounds congested. Also c/o cough.

## 2019-10-16 NOTE — ED Provider Notes (Signed)
Emergency Department Provider Note  ____________________________________________  Time seen: Approximately 3:28 PM  I have reviewed the triage vital signs and the nursing notes.   HISTORY  Chief Complaint Sore Throat   Historian Patient   HPI Kendra Potts is a 34 y.o. female presents to the emergency department with pharyngitis for less than 24 hours.  Patient states that she experiences strep throat frequently and her symptoms currently feel similar.  She has had headache, rhinorrhea and cough as well.  She states that she does not wish to be tested for COVID-19 because "I do not have it".  She denies chest pain, chest tightness or abdominal pain.  She has been able to tolerate her own secretions at home.  She denies history of peritonsillar abscess.   Past Medical History:  Diagnosis Date  . Seizure ([place])      Immunizations up to date:  Yes.     Past Medical History:  Diagnosis Date  . Seizure ([place])     There are no problems to display for this patient.   Past Surgical History:  Procedure Laterality Date  . EXTERNAL EAR SURGERY      Prior to Admission medications   Not on File    Allergies Patient has no known allergies.  History reviewed. No pertinent family history.  Social History Social History   Tobacco Use  . Smoking status: Current Every Day Smoker    Types: Cigarettes  . Smokeless tobacco: Never Used  Substance Use Topics  . Alcohol use: Yes  . Drug use: Yes    Types: Marijuana     Review of Systems  Constitutional: No fever/chills Eyes:  No discharge ENT: Patient has pharyngitis.  Respiratory: no cough. No SOB/ use of accessory muscles to breath Gastrointestinal:   No nausea, no vomiting.  No diarrhea.  No constipation. Musculoskeletal: Negative for musculoskeletal pain. Skin: Negative for rash, abrasions, lacerations, ecchymosis.    ____________________________________________   PHYSICAL EXAM:  VITAL SIGNS: ED  Triage Vitals  Enc Vitals Group     BP 10/16/19 1413 114/74     Pulse Rate 10/16/19 1413 76     Resp 10/16/19 1413 15     Temp 10/16/19 1413 98.4 F (36.9 C)     Temp Source 10/16/19 1413 Oral     SpO2 10/16/19 1413 99 %     Weight 10/16/19 1413 160 lb (72.6 kg)     Height 10/16/19 1413 5\' 2"  (1.575 m)     Head Circumference --      Peak Flow --      Pain Score 10/16/19 1420 5     Pain Loc --      Pain Edu? --      Excl. in [place]? --      Constitutional: Alert and oriented. Well appearing and in no acute distress. Eyes: Conjunctivae are normal. PERRL. EOMI. Head: Atraumatic. ENT:      Ears: TMs are pearly.       Nose: No congestion/rhinnorhea.      Mouth/Throat: Mucous membranes are moist.  Posterior pharynx is mildly erythematous.  Tonsils appear symmetrical without hypertrophy or exudate. Neck: No stridor.  No cervical spine tenderness to palpation. Hematological/Lymphatic/Immunilogical: Palpable cervical lymphadenopathy. Cardiovascular: Normal rate, regular rhythm. Normal S1 and S2.  Good peripheral circulation. Respiratory: Normal respiratory effort without tachypnea or retractions. Lungs CTAB. Good air entry to the bases with no decreased or absent breath sounds Musculoskeletal: Full range of motion to all extremities. No obvious  deformities noted Neurologic:  Normal for age. No gross focal neurologic deficits are appreciated.  Skin:  Skin is warm, dry and intact. No rash noted. Psychiatric: Mood and affect are normal for age. Speech and behavior are normal.   ____________________________________________   LABS (all labs ordered are listed, but only abnormal results are displayed)  Labs Reviewed  GROUP A STREP BY PCR   ____________________________________________  EKG   ____________________________________________  RADIOLOGY  No results found.  ____________________________________________    PROCEDURES  Procedure(s) performed:      Procedures     Medications - No data to display   ____________________________________________   INITIAL IMPRESSION / ASSESSMENT AND PLAN / ED COURSE  Pertinent labs & imaging results that were available during my care of the patient were reviewed by me and considered in my medical decision making (see chart for details).      Assessment and Plan: Pharyngitis 35 year old female presents to the emergency department with pharyngitis for less than 24 hours.  Vital signs were reviewed at triage and were reassuring.  Differential diagnosis includes viral URI versus group A strep pharyngitis.  Will obtain group A strep testing and will reevaluate.  Group A strep testing is negative.  Patient declined COVID-19 testing.  A work note was provided.  Tylenol and ibuprofen alternating were recommended for pharyngitis.  Return precautions were given to return with new or worsening symptoms.  All patient questions were answered. ____________________________________________  FINAL CLINICAL IMPRESSION(S) / ED DIAGNOSES  Final diagnoses:  Pharyngitis, unspecified etiology      NEW MEDICATIONS STARTED DURING THIS VISIT:  ED Discharge Orders    None          This chart was dictated using voice recognition software/Dragon. Despite best efforts to proofread, errors can occur which can change the meaning. Any change was purely unintentional.     Lannie Fields, PA-C 10/16/19 1645    Harvest Dark, MD 10/16/19 [phone]

## 2019-11-22 ENCOUNTER — Encounter: Payer: Self-pay | Admitting: *Deleted

## 2019-11-22 ENCOUNTER — Other Ambulatory Visit: Payer: Self-pay

## 2019-11-22 ENCOUNTER — Emergency Department
Admission: EM | Admit: 2019-11-22 | Discharge: 2019-11-22 | Disposition: A | Discharge status: Home / Self Care | Payer: Self-pay | Attending: Emergency Medicine | Admitting: Emergency Medicine

## 2019-11-22 DIAGNOSIS — K0889 Other specified disorders of teeth and supporting structures: Secondary | ICD-10-CM | POA: Insufficient documentation

## 2019-11-22 DIAGNOSIS — F17210 Nicotine dependence, cigarettes, uncomplicated: Secondary | ICD-10-CM | POA: Insufficient documentation

## 2019-11-22 MED ORDER — AMOXICILLIN 500 MG PO CAPS
500.0000 mg | ORAL_CAPSULE | Freq: Once | ORAL | Status: AC
  Administered 2019-11-22: 500 mg via ORAL
  Filled 2019-11-22: qty 1

## 2019-11-22 MED ORDER — TRAMADOL HCL 50 MG PO TABS: 50.0000 mg | ORAL_TABLET | Freq: Four times a day (QID) | ORAL | 0 refills | Status: AC | PRN

## 2019-11-22 MED ORDER — AMOXICILLIN 875 MG PO TABS: 875.0000 mg | ORAL_TABLET | Freq: Two times a day (BID) | ORAL | 0 refills | Status: AC

## 2019-11-22 NOTE — ED Notes (Signed)
See triage note  Presents with possible dental abscess  Having pain to right lower gumline

## 2019-11-22 NOTE — ED Triage Notes (Signed)
Pt has left lower dental pain.  Pain for 3 days.  Using oragel without relief.  Pt alert.

## 2019-11-22 NOTE — ED Provider Notes (Signed)
Emergency Department Provider Note  ____________________________________________  Time seen: Approximately 7:24 PM  I have reviewed the triage vital signs and the nursing notes.   HISTORY  Chief Complaint Dental Pain   Historian Patient   HPI Kendra Potts is a 34 y.o. female presents to the emergency department with dental pain.  Patient states that she has dental pain on both sides of her lower jaw.  She states that she has had pain for the past 3 days.  No fever or chills at home.  No pain underneath the tongue.  She states that she has had dental abscesses in the past.  She denies difficulty swallowing.  No other alleviating measures have been attempted.   Past Medical History:  Diagnosis Date  . Seizure ([place])      Immunizations up to date:  Yes.     Past Medical History:  Diagnosis Date  . Seizure ([place])     There are no problems to display for this patient.   Past Surgical History:  Procedure Laterality Date  . EXTERNAL EAR SURGERY      Prior to Admission medications   Medication Sig Start Date End Date Taking? Authorizing Provider  amoxicillin (AMOXIL) 875 MG tablet Take 1 tablet (875 mg total) by mouth 2 (two) times daily for 10 days. 11/22/19 12/02/19  Lannie Fields, PA-C  traMADol (ULTRAM) 50 MG tablet Take 1 tablet (50 mg total) by mouth every 6 (six) hours as needed for up to 3 days. 11/22/19 11/25/19  Lannie Fields, PA-C    Allergies Patient has no known allergies.  No family history on file.  Social History Social History   Tobacco Use  . Smoking status: Current Every Day Smoker    Types: Cigarettes  . Smokeless tobacco: Never Used  Substance Use Topics  . Alcohol use: Not Currently  . Drug use: Yes    Types: Marijuana     Review of Systems  Constitutional: No fever/chills Eyes:  No discharge ENT: Patient has dental pain.  Respiratory: no cough. No SOB/ use of accessory muscles to breath Gastrointestinal:   No nausea, no  vomiting.  No diarrhea.  No constipation. Musculoskeletal: Negative for musculoskeletal pain. Skin: Negative for rash, abrasions, lacerations, ecchymosis.   ____________________________________________   PHYSICAL EXAM:  VITAL SIGNS: ED Triage Vitals  Enc Vitals Group     BP 11/22/19 1704 (!) 142/111     Pulse Rate 11/22/19 1704 74     Resp 11/22/19 1704 18     Temp 11/22/19 1704 99.6 F (37.6 C)     Temp Source 11/22/19 1704 Oral     SpO2 11/22/19 1704 100 %     Weight 11/22/19 1705 152 lb (68.9 kg)     Height 11/22/19 1705 5\' 2"  (1.575 m)     Head Circumference --      Peak Flow --      Pain Score 11/22/19 1705 10     Pain Loc --      Pain Edu? --      Excl. in [place]? --      Constitutional: Alert and oriented. Well appearing and in no acute distress. Eyes: Conjunctivae are normal. PERRL. EOMI. Head: Atraumatic. ENT:      Ears:       Nose: No congestion/rhinnorhea.      Mouth/Throat: Mucous membranes are moist.  Patient has no obvious swelling of the lower jaw.  She is maintaining her own secretions.  No pain to  palpation underneath the tongue.  Patient has multiple teeth affected by dental caries. Cardiovascular: Normal rate, regular rhythm. Normal S1 and S2.  Good peripheral circulation. Respiratory: Normal respiratory effort without tachypnea or retractions. Lungs CTAB. Good air entry to the bases with no decreased or absent breath sounds Gastrointestinal: Bowel sounds x 4 quadrants. Soft and nontender to palpation. No guarding or rigidity. No distention. Musculoskeletal: Full range of motion to all extremities. No obvious deformities noted Neurologic:  Normal for age. No gross focal neurologic deficits are appreciated.  Skin:  Skin is warm, dry and intact. No rash noted. Psychiatric: Mood and affect are normal for age. Speech and behavior are normal.   ____________________________________________   LABS (all labs ordered are listed, but only abnormal results are  displayed)  Labs Reviewed - No data to display ____________________________________________  EKG   ____________________________________________  RADIOLOGY   No results found.  ____________________________________________    PROCEDURES  Procedure(s) performed:     Procedures     Medications  amoxicillin (AMOXIL) capsule 500 mg (has no administration in time range)     ____________________________________________   INITIAL IMPRESSION / ASSESSMENT AND PLAN / ED COURSE  Pertinent labs & imaging results that were available during my care of the patient were reviewed by me and considered in my medical decision making (see chart for details).      Assessment and plan Dental pain 34 year old female presents to the emergency department with dental pain.  Patient was hypertensive at triage but vital signs were otherwise reassuring.  Patient was maintaining her own secretions and had no difficulty swallowing.  Multiple lower teeth were affected by dental caries.  Patient was started on amoxicillin and patient was prescribed tramadol for pain.  She was advised to make an appointment with a local dentist as soon as possible.  Return precautions were given to return with new or worsening symptoms.    ____________________________________________  FINAL CLINICAL IMPRESSION(S) / ED DIAGNOSES  Final diagnoses:  Pain, dental      NEW MEDICATIONS STARTED DURING THIS VISIT:  ED Discharge Orders         Ordered    amoxicillin (AMOXIL) 875 MG tablet  2 times daily     11/22/19 1921    traMADol (ULTRAM) 50 MG tablet  Every 6 hours PRN     11/22/19 1921              This chart was dictated using voice recognition software/Dragon. Despite best efforts to proofread, errors can occur which can change the meaning. Any change was purely unintentional.     Lannie Fields, PA-C 11/22/19 Ines Bloomer, MD 11/25/19 [phone]

## 2020-07-15 ENCOUNTER — Emergency Department: Payer: HRSA Program

## 2020-07-15 ENCOUNTER — Emergency Department
Admission: EM | Admit: 2020-07-15 | Discharge: 2020-07-16 | Disposition: A | Discharge status: Home / Self Care | Payer: HRSA Program | Attending: Emergency Medicine | Admitting: Emergency Medicine

## 2020-07-15 ENCOUNTER — Other Ambulatory Visit: Payer: Self-pay

## 2020-07-15 ENCOUNTER — Encounter: Payer: Self-pay | Admitting: Radiology

## 2020-07-15 DIAGNOSIS — U071 COVID-19: Secondary | ICD-10-CM | POA: Insufficient documentation

## 2020-07-15 DIAGNOSIS — F17210 Nicotine dependence, cigarettes, uncomplicated: Secondary | ICD-10-CM | POA: Insufficient documentation

## 2020-07-15 DIAGNOSIS — R519 Headache, unspecified: Secondary | ICD-10-CM | POA: Diagnosis present

## 2020-07-15 LAB — CBC WITH DIFFERENTIAL/PLATELET
Abs Immature Granulocytes: 0 10*3/uL (ref 0.00–0.07)
Basophils Absolute: 0 10*3/uL (ref 0.0–0.1)
Basophils Relative: 1 %
Eosinophils Absolute: 0 10*3/uL (ref 0.0–0.5)
Eosinophils Relative: 0 %
HCT: 43.6 % (ref 36.0–46.0)
Hemoglobin: 14.8 g/dL (ref 12.0–15.0)
Immature Granulocytes: 0 %
Lymphocytes Relative: 34 %
Lymphs Abs: 1.3 10*3/uL (ref 0.7–4.0)
MCH: 29.9 pg (ref 26.0–34.0)
MCHC: 33.9 g/dL (ref 30.0–36.0)
MCV: 88.1 fL (ref 80.0–100.0)
Monocytes Absolute: 0.4 10*3/uL (ref 0.1–1.0)
Monocytes Relative: 11 %
Neutro Abs: 2.1 10*3/uL (ref 1.7–7.7)
Neutrophils Relative %: 54 %
Platelets: 209 10*3/uL (ref 150–400)
RBC: 4.95 MIL/uL (ref 3.87–5.11)
RDW: 13.1 % (ref 11.5–15.5)
WBC: 3.9 10*3/uL — ABNORMAL LOW (ref 4.0–10.5)
nRBC: 0 % (ref 0.0–0.2)

## 2020-07-15 MED ORDER — KETOROLAC TROMETHAMINE 30 MG/ML IJ SOLN
15.0000 mg | Freq: Once | INTRAMUSCULAR | Status: AC
  Administered 2020-07-16: 15 mg via INTRAVENOUS
  Filled 2020-07-15: qty 1

## 2020-07-15 MED ORDER — METOCLOPRAMIDE HCL 5 MG/ML IJ SOLN
10.0000 mg | Freq: Once | INTRAMUSCULAR | Status: AC
  Administered 2020-07-16: 10 mg via INTRAVENOUS
  Filled 2020-07-15: qty 2

## 2020-07-15 MED ORDER — SODIUM CHLORIDE 0.9 % IV BOLUS
1000.0000 mL | Freq: Once | INTRAVENOUS | Status: AC
  Administered 2020-07-16: 1000 mL via INTRAVENOUS

## 2020-07-15 NOTE — ED Notes (Signed)
Patient resting with eyes closed when this RN into room, easily aroused. Patient reports having symptoms for the past 2 days.  Reports sharp pain in her head that comes and goes and also reports she has had fever that comes and goes.

## 2020-07-15 NOTE — ED Triage Notes (Signed)
Pt c/o of head pain that started after pt "broke out" in fever on Friday after work. Pt states she took Nyquil and Tylenol with minimal relief. Pt denies headache pain. Pt describes pain as sharp nerve pain that moves all around her head.

## 2020-07-16 ENCOUNTER — Encounter: Payer: Self-pay | Admitting: Radiology

## 2020-07-16 LAB — COMPREHENSIVE METABOLIC PANEL
ALT: 12 U/L (ref 0–44)
AST: 22 U/L (ref 15–41)
Albumin: 4 g/dL (ref 3.5–5.0)
Alkaline Phosphatase: 44 U/L (ref 38–126)
Anion gap: 8 (ref 5–15)
BUN: 13 mg/dL (ref 6–20)
CO2: 23 mmol/L (ref 22–32)
Calcium: 8.7 mg/dL — ABNORMAL LOW (ref 8.9–10.3)
Chloride: 109 mmol/L (ref 98–111)
Creatinine, Ser: 0.66 mg/dL (ref 0.44–1.00)
GFR, Estimated: >60 mL/min (ref >60)
Glucose, Bld: 125 mg/dL — ABNORMAL HIGH (ref 70–99)
Potassium: 3.5 mmol/L (ref 3.5–5.1)
Sodium: 140 mmol/L (ref 135–145)
Total Bilirubin: 0.5 mg/dL (ref 0.3–1.2)
Total Protein: 7 g/dL (ref 6.5–8.1)

## 2020-07-16 LAB — RESP PANEL BY RT-PCR (FLU A&B, COVID) ARPGX2
Influenza A by PCR: NEGATIVE
Influenza B by PCR: NEGATIVE
SARS Coronavirus 2 by RT PCR: POSITIVE — AB

## 2020-07-16 NOTE — ED Provider Notes (Signed)
[facility] Emergency Department Provider Note  ____________________________________________  Time seen: Approximately 12:40 AM  I have reviewed the triage vital signs and the nursing notes.   HISTORY  Chief Complaint Headache   HPI Kendra Potts is a 34 y.o. female who presents for evaluation of fever, headache, cough, diarrhea.  Patient reports that her symptoms started 2 days ago.  She has been taking NyQuil and Tylenol around-the-clock.  She is complaining of intermittent stabbing headaches, several daily episodes of watery diarrhea, mild dry cough, and fever.  No chest pain or shortness of breath.  She has had mild abdominal cramping with the diarrhea.  No dysuria or hematuria, no nausea or vomiting, no known exposures to Covid or flu.  Patient is not vaccinated.   Past Medical History:  Diagnosis Date  . Seizure [facility])      Past Surgical History:  Procedure Laterality Date  . EXTERNAL EAR SURGERY      Prior to Admission medications   Not on File    Allergies Patient has no known allergies.  No family history on file.  Social History Social History   Tobacco Use  . Smoking status: Current Every Day Smoker    Types: Cigarettes  . Smokeless tobacco: Never Used  Substance Use Topics  . Alcohol use: Not Currently  . Drug use: Yes    Types: Marijuana    Review of Systems  Constitutional: + fever. Eyes: Negative for visual changes. ENT: Negative for sore throat. Neck: No neck pain  Cardiovascular: Negative for chest pain. Respiratory: Negative for shortness of breath. + cough Gastrointestinal: Negative for abdominal pain, vomiting. + diarrhea. Genitourinary: Negative for dysuria. Musculoskeletal: Negative for back pain. Skin: Negative for rash. Neurological: Negative for weakness or numbness. + HA Psych: No SI or HI  ____________________________________________   PHYSICAL EXAM:  VITAL SIGNS: ED Triage Vitals  Enc  Vitals Group     BP 07/15/20 2127 108/66     Pulse Rate 07/15/20 2127 85     Resp 07/15/20 2127 18     Temp 07/15/20 2027 98.6 F (37 C)     Temp Source 07/15/20 2027 Oral     SpO2 07/15/20 2127 95 %     Weight --      Height --      Head Circumference --      Peak Flow --      Pain Score 07/15/20 2027 10     Pain Loc --      Pain Edu? --      Excl. in [place]? --     Constitutional: Alert and oriented. Well appearing and in no apparent distress. Room smells like MJ very strongly HEENT:      Head: Normocephalic and atraumatic.         Eyes: Conjunctivae are normal. Sclera is non-icteric.       Mouth/Throat: Mucous membranes are moist.       Neck: Supple with no signs of meningismus. Cardiovascular: Regular rate and rhythm. No murmurs, gallops, or rubs.  Respiratory: Normal respiratory effort. Lungs are clear to auscultation bilaterally.  Gastrointestinal: Soft, non tender, and non distended. Musculoskeletal:  No edema, cyanosis, or erythema of extremities. Neurologic: Normal speech and language. Face is symmetric. Moving all extremities. No gross focal neurologic deficits are appreciated. Skin: Skin is warm, dry and intact. No rash noted. Psychiatric: Mood and affect are normal. Speech and behavior are normal.  ____________________________________________   LABS (all labs ordered  are listed, but only abnormal results are displayed)  Labs Reviewed  RESP PANEL BY RT-PCR (FLU A&B, COVID) ARPGX2 - Abnormal; Notable for the following components:      Result Value   SARS Coronavirus 2 by RT PCR POSITIVE (*)    All other components within normal limits  CBC WITH DIFFERENTIAL/PLATELET - Abnormal; Notable for the following components:   WBC 3.9 (*)    All other components within normal limits  COMPREHENSIVE METABOLIC PANEL - Abnormal; Notable for the following components:   Glucose, Bld 125 (*)    Calcium 8.7 (*)    All other components within normal limits  URINALYSIS, COMPLETE  (UACMP) WITH MICROSCOPIC  POC URINE PREG, ED   ____________________________________________  EKG  none  ____________________________________________  RADIOLOGY  I have personally reviewed the images performed during this visit and I agree with the Radiologist's read.   Interpretation by Radiologist:  DG Chest Portable 1 View  Result Date: 07/16/2020 CLINICAL DATA:  Cough and fever, headache EXAM: PORTABLE CHEST 1 VIEW COMPARISON:  04/10/2012 FINDINGS: Overlying breast tissue and breast prostheses result in increased attenuation in the lung bases. There is diffuse airways thickening and some streaky opacities in the right lung base which could be atelectatic or reflect early infection in the appropriate clinical setting. No pneumothorax. No effusion. No convincing features of edema. No acute osseous or soft tissue abnormality. IMPRESSION: Diffuse airways thickening and some streaky opacities in the right lung base which could be atelectatic or reflect early infection in the appropriate clinical setting. Electronically Signed   By: Lovena Le M.D.   On: 07/16/2020 00:32     ____________________________________________   PROCEDURES  Procedure(s) performed:yes .1-3 Lead EKG Interpretation Performed by: Rudene Re, MD Authorized by: Rudene Re, MD     Interpretation: non-specific     ECG rate assessment: normal     Rhythm: sinus rhythm     Ectopy: none     Critical Care performed:  None ____________________________________________   INITIAL IMPRESSION / ASSESSMENT AND PLAN / ED COURSE   34 y.o. female who presents for evaluation of fever, headache, cough, diarrhea x 2 days.  Patient is well-appearing in no distress with normal work of breathing, normal sats.  Exam is unremarkable.  Of note, patient's room smells very strongly of marijuana.  She does endorse use of marijuana but denies any other drug use or alcohol.  Covid positive here.  No hypoxia both at  rest and with ambulation (sats 98-100%).  Chest x-ray visualized by me showing signs of Covid pneumonia, confirmed by radiology.  Labs reviewed showing mild leukopenia with a white count of 3.9 with normal differential consistent with Covid infection.  No significant electrolyte derangements other than mild elevated glucose of 125 in a patient with no history of diabetes.  Discussed this finding with patient recommended follow-up with PCP for fasting blood glucose and hemoglobin A1c.  Discussed quarantining, home care including monitoring oxygenation at home, and discussed my standard return precautions.  Will refer patient to post Covid clinic for close follow-up.  At this time with no history of obesity and no chronic medical illnesses patient does not qualify for monoclonal antibody infusion.  Old medical records review.  Patient symptoms were treated with IV fluids, IV Reglan, IV Toradol for her headache.      _____________________________________________ Please note:  Patient was evaluated in Emergency Department today for the symptoms described in the history of present illness. Patient was evaluated in the context of  the global COVID-19 pandemic, which necessitated consideration that the patient might be at risk for infection with the SARS-CoV-2 virus that causes COVID-19. Institutional protocols and algorithms that pertain to the evaluation of patients at risk for COVID-19 are in a state of rapid change based on information released by regulatory bodies including the CDC and federal and state organizations. These policies and algorithms were followed during the patient's care in the ED.  Some ED evaluations and interventions may be delayed as a result of limited staffing during the pandemic.   [place] Controlled Substance Database was reviewed by me. ____________________________________________   FINAL CLINICAL IMPRESSION(S) / ED DIAGNOSES   Final diagnoses:  COVID-19      NEW MEDICATIONS  STARTED DURING THIS VISIT:  ED Discharge Orders    None       Note:  This document was prepared using Dragon voice recognition software and may include unintentional dictation errors.    Alfred Levins, [place], MD 07/16/20 [phone]

## 2020-07-16 NOTE — ED Notes (Signed)
Date and time results received: 07/16/20 12:14 AM (use smartphrase ".now" to insert current time)  Test: COVID-19 Critical Value: Positive  Name of Provider Notified: Dr. Alfred Levins  Orders Received? Or Actions Taken?: No new orders given at this time

## 2020-07-16 NOTE — ED Notes (Signed)
Pt ambulatory sats maintained >98%.

## 2020-07-16 NOTE — ED Notes (Signed)
Pt states

## 2020-07-16 NOTE — Discharge Instructions (Addendum)
To help boost your immune system against COVID-19, please take:  - Vitamin D3 4,000 IU/day - Vitamin C 500-1,000?mg twice a day - Quercetin 250?mg twice a day - Zinc 100?mg/day - Melatonin 10?mg before bedtime (causes drowsiness) - Aspirin 325?mg/day (unless contraindicated) - Pulse Oximeter Monitoring of oxygen saturation is recommended - check your oxygen 3 times a day if less than 90% return to the ER  These medications are all over-the-counter and do not need a prescription.  [place]  Follow these instructions at home:  Protecting others To avoid spreading the illness to other people: Quarantine in your home until you have had no cough and fever for 7 days. Household members should also be quarantine for at least 14 days after being exposed to you. Wash your hands often with soap and water. If soap and water are not available, use an alcohol-based hand sanitizer. If you have not cleaned your hands, do not touch your face. Make sure that all people in your household wash their hands well and often. Cover your nose and mouth when you cough or sneeze. Throw away used tissues. Stay home if you have any cold-like or flu-like symptoms. General instructions Go to your local pharmacy and buy a pulse oximeter (this is a machine that measures your oxygen). Check your oxygen levels at least 3 times a day. If your oxygen level is 90% or less return to the emergency room immediately Take over-the-counter and prescription medicines only as told by your health care provider. If you need medication for fever take tylenol or ibuprofen Drink enough fluid to keep your urine pale yellow. Rest at home as directed by your health care provider. Do not give aspirin to a child with the flu, because of the association with Reye's syndrome. Do not use tobacco products, including cigarettes, chewing tobacco, and e-cigarettes. If you need help quitting,  ask your health care provider. Keep all follow-up visits as told by your health care provider. This is important. How is this prevented? Avoid areas where an outbreak has been reported. Avoid large groups of people. Keep a safe distance from people who are coughing and sneezing. Do not touch your face if you have not cleaned your hands. When you are around people who are sick or might be sick, wear a mask to protect yourself. Contact a health care provider if: You have symptoms of SARS (cough, fever, chest pain, shortness of breath) that are not getting better at home. You have a fever. If you have difficulty breathing go to your local ER or call 911

## 2020-07-17 ENCOUNTER — Encounter: Payer: Self-pay | Admitting: Oncology

## 2020-07-17 ENCOUNTER — Telehealth: Payer: Self-pay | Admitting: Oncology

## 2020-07-17 NOTE — Telephone Encounter (Signed)
Re: [facility]  Called to Discuss with patient about Covid symptoms and the use of regeneron, a monoclonal antibody infusion for those with mild to moderate Covid symptoms and at a high risk of hospitalization.     Pt is qualified for this infusion at the [facility] infusion center due to co-morbid conditions and/or a member of an at-risk group.    Past Medical History:  Diagnosis Date  . Seizure [facility])     Specific risk condition- Current smoker and hx of seizures  Onset sx 07/14/20  Unable to reach pt. Left VM and MCM.  Rulon Abide, AGNP-C [phone] ([place])

## 2020-09-27 ENCOUNTER — Emergency Department
Admission: EM | Admit: 2020-09-27 | Discharge: 2020-09-28 | Disposition: A | Discharge status: Home / Self Care | Payer: Self-pay | Attending: Emergency Medicine | Admitting: Emergency Medicine

## 2020-09-27 ENCOUNTER — Emergency Department: Payer: Self-pay

## 2020-09-27 ENCOUNTER — Other Ambulatory Visit: Payer: Self-pay

## 2020-09-27 DIAGNOSIS — S0240DA Maxillary fracture, left side, initial encounter for closed fracture: Secondary | ICD-10-CM

## 2020-09-27 DIAGNOSIS — F17210 Nicotine dependence, cigarettes, uncomplicated: Secondary | ICD-10-CM | POA: Insufficient documentation

## 2020-09-27 DIAGNOSIS — R0789 Other chest pain: Secondary | ICD-10-CM | POA: Insufficient documentation

## 2020-09-27 DIAGNOSIS — S022XXA Fracture of nasal bones, initial encounter for closed fracture: Secondary | ICD-10-CM

## 2020-09-27 MED ORDER — KETOROLAC TROMETHAMINE 30 MG/ML IJ SOLN
30.0000 mg | Freq: Once | INTRAMUSCULAR | Status: AC
  Administered 2020-09-27: 30 mg via INTRAMUSCULAR
  Filled 2020-09-27: qty 1

## 2020-09-27 MED ORDER — HYDROCODONE-ACETAMINOPHEN 5-325 MG PO TABS: 1.0000 | ORAL_TABLET | ORAL | 0 refills | Status: AC | PRN
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
## 2020-09-28 NOTE — ED Notes (Signed)
ED Provider at bedside. 

## 2020-09-28 NOTE — ED Notes (Signed)
Pt becoming irate over not being able to find ride home. This RN called cab compnay twice with no answer in attmept to get pt a ride. Pt states she isnt waiting and she is leaving. Pt is yelling and cussing. Pt took belongings and eloped.

## 2021-10-14 ENCOUNTER — Emergency Department: Payer: Self-pay

## 2021-10-14 ENCOUNTER — Emergency Department
Admission: EM | Admit: 2021-10-14 | Discharge: 2021-10-14 | Disposition: A | Discharge status: Home / Self Care | Payer: Self-pay | Attending: Emergency Medicine | Admitting: Emergency Medicine

## 2021-10-14 ENCOUNTER — Other Ambulatory Visit: Payer: Self-pay

## 2021-10-14 DIAGNOSIS — F1210 Cannabis abuse, uncomplicated: Secondary | ICD-10-CM | POA: Insufficient documentation

## 2021-10-14 DIAGNOSIS — R519 Headache, unspecified: Secondary | ICD-10-CM

## 2021-10-14 DIAGNOSIS — Y908 Blood alcohol level of 240 mg/100 ml or more: Secondary | ICD-10-CM | POA: Insufficient documentation

## 2021-10-14 DIAGNOSIS — F1090 Alcohol use, unspecified, uncomplicated: Secondary | ICD-10-CM

## 2021-10-14 DIAGNOSIS — Z789 Other specified health status: Secondary | ICD-10-CM

## 2021-10-14 LAB — ETHANOL: Alcohol, Ethyl (B): 252 mg/dL — ABNORMAL HIGH (ref <10)

## 2021-10-14 LAB — CBC WITH DIFFERENTIAL/PLATELET
Abs Immature Granulocytes: 0.01 10*3/uL (ref 0.00–0.07)
Basophils Absolute: 0 10*3/uL (ref 0.0–0.1)
Basophils Relative: 0 %
Eosinophils Absolute: 0 10*3/uL (ref 0.0–0.5)
Eosinophils Relative: 1 %
HCT: 44.4 % (ref 36.0–46.0)
Hemoglobin: 14.6 g/dL (ref 12.0–15.0)
Immature Granulocytes: 0 %
Lymphocytes Relative: 48 %
Lymphs Abs: 2.8 10*3/uL (ref 0.7–4.0)
MCH: 29.4 pg (ref 26.0–34.0)
MCHC: 32.9 g/dL (ref 30.0–36.0)
MCV: 89.5 fL (ref 80.0–100.0)
Monocytes Absolute: 0.3 10*3/uL (ref 0.1–1.0)
Monocytes Relative: 5 %
Neutro Abs: 2.8 10*3/uL (ref 1.7–7.7)
Neutrophils Relative %: 46 %
Platelets: 254 10*3/uL (ref 150–400)
RBC: 4.96 MIL/uL (ref 3.87–5.11)
RDW: 12.4 % (ref 11.5–15.5)
WBC: 6 10*3/uL (ref 4.0–10.5)
nRBC: 0 % (ref 0.0–0.2)

## 2021-10-14 LAB — URINALYSIS, COMPLETE (UACMP) WITH MICROSCOPIC
Bacteria, UA: NONE SEEN
Bilirubin Urine: NEGATIVE
Glucose, UA: NEGATIVE mg/dL
Ketones, ur: NEGATIVE mg/dL
Leukocytes,Ua: NEGATIVE
Nitrite: NEGATIVE
Protein, ur: NEGATIVE mg/dL
RBC / HPF: >50 RBC/hpf — ABNORMAL HIGH (ref 0–5)
Specific Gravity, Urine: 1.006 (ref 1.005–1.030)
pH: 8 (ref 5.0–8.0)

## 2021-10-14 LAB — URINE DRUG SCREEN, QUALITATIVE (ARMC ONLY)
Amphetamines, Ur Screen: NOT DETECTED
Barbiturates, Ur Screen: NOT DETECTED
Benzodiazepine, Ur Scrn: NOT DETECTED
Cannabinoid 50 Ng, Ur ~~LOC~~: POSITIVE — AB
Cocaine Metabolite,Ur ~~LOC~~: NOT DETECTED
MDMA (Ecstasy)Ur Screen: NOT DETECTED
Methadone Scn, Ur: NOT DETECTED
Opiate, Ur Screen: NOT DETECTED
Phencyclidine (PCP) Ur S: NOT DETECTED
Tricyclic, Ur Screen: NOT DETECTED

## 2021-10-14 LAB — BASIC METABOLIC PANEL
Anion gap: 9 (ref 5–15)
BUN: <5 mg/dL — ABNORMAL LOW (ref 6–20)
CO2: 24 mmol/L (ref 22–32)
Calcium: 8.3 mg/dL — ABNORMAL LOW (ref 8.9–10.3)
Chloride: 108 mmol/L (ref 98–111)
Creatinine, Ser: 0.56 mg/dL (ref 0.44–1.00)
GFR, Estimated: >60 mL/min (ref >60)
Glucose, Bld: 92 mg/dL (ref 70–99)
Potassium: 3.2 mmol/L — ABNORMAL LOW (ref 3.5–5.1)
Sodium: 141 mmol/L (ref 135–145)

## 2021-10-14 LAB — POC URINE PREG, ED: Preg Test, Ur: NEGATIVE

## 2021-10-14 MED ORDER — POTASSIUM CHLORIDE CRYS ER 20 MEQ PO TBCR
40.0000 meq | EXTENDED_RELEASE_TABLET | Freq: Once | ORAL | Status: AC
  Administered 2021-10-14: 40 meq via ORAL
  Filled 2021-10-14: qty 2

## 2021-10-14 NOTE — ED Triage Notes (Signed)
Pt presents to ER via ems c/o headache, arm pain and back pain that started yesterday at work.  Pt states she is "having siezures" while bouncing around in her wheelchair.  Pt denies much ETOH use.  Pt does seem to be slurring some of her words and falling asleep in triage.  Pt A&O x3 at this time.   ?

## 2021-10-14 NOTE — ED Notes (Signed)
To ct scan

## 2021-10-14 NOTE — ED Notes (Addendum)
Pt brought in via ems from home.  Pt reports a headache since yesterday with n/v.  No otc meds for h/a.  Pt tearful pt awake, cooperative.  Pt in hallway bed.   ?

## 2021-10-14 NOTE — Discharge Instructions (Signed)
Please do not drive until you can be seen by neurology. Please seek medical attention for any high fevers, chest pain, shortness of breath, change in behavior, persistent vomiting, bloody stool or any other new or concerning symptoms. ? ?

## 2021-10-14 NOTE — ED Notes (Signed)
Pt refusing bloodwork at this time

## 2021-10-14 NOTE — ED Provider Notes (Signed)
? ?[facility] ?Provider Note ? ? ? Event Date/Time  ? First MD Initiated Contact with Patient 10/14/21 2055   ?  (approximate) ? ? ?History  ? ?Headache ? ? ?HPI ? ?Kendra Potts is a 36 y.o. female  who, per outpatient note dated 07/17/20 has history of seizure, who presents to the emergency department today because of concern for possible strokes.  The patient states that she had a stroke last night at work but they sent her home.  She states she then had 2 more strokes today.  In describing her stroke she mentions jerking movement of her extremities.  I did try to clarify if she meant seizures given the patient has history of seizures.  Patient unfortunately is not a great historian and continues to say stroke.  She denies any focal weakness.  States she has had a headache since this happened.  States she has a history of seizures and that she thinks she is post to be on medication but was currently not on any medication. ? ?  ? ? ?Physical Exam  ? ?Triage Vital Signs: ?ED Triage Vitals  ?Enc Vitals Group  ?   BP 10/14/21 2038 (!) 140/104  ?   Pulse Rate 10/14/21 2038 78  ?   Resp 10/14/21 2038 17  ?   Temp 10/14/21 2038 98.4 ?F (36.9 ?C)  ?   Temp Source 10/14/21 2038 Oral  ?   SpO2 10/14/21 2038 98 %  ?   Weight 10/14/21 2039 132 lb 4.4 oz (60 kg)  ?   Height 10/14/21 2039 '5\' 4"'$  (1.626 m)  ?   Head Circumference --   ?   Peak Flow --   ?   Pain Score 10/14/21 2039 10  ? ?Most recent vital signs: ?Vitals:  ? 10/14/21 2038  ?BP: (!) 140/104  ?Pulse: 78  ?Resp: 17  ?Temp: 98.4 ?F (36.9 ?C)  ?SpO2: 98%  ? ? ?General: Awake, no distress. Poor historian. ?CV:  Good peripheral perfusion.  ?Resp:  Normal effort.  ?Abd:  No distention.  ?Neuro:  Moving all extremities. Sensation grossly intact.  ? ? ?ED Results / Procedures / Treatments  ? ?Labs ?(all labs ordered are listed, but only abnormal results are displayed) ?Labs Reviewed  ?BASIC METABOLIC PANEL - Abnormal; Notable for the following  components:  ?    Result Value  ? Potassium 3.2 (*)   ? BUN <5 (*)   ? Calcium 8.3 (*)   ? All other components within normal limits  ?ETHANOL - Abnormal; Notable for the following components:  ? Alcohol, Ethyl (B) 252 (*)   ? All other components within normal limits  ?URINE DRUG SCREEN, QUALITATIVE ([facility] ONLY) - Abnormal; Notable for the following components:  ? Cannabinoid 50 Ng, Ur [place] POSITIVE (*)   ? All other components within normal limits  ?URINALYSIS, COMPLETE (UACMP) WITH MICROSCOPIC - Abnormal; Notable for the following components:  ? Color, Urine STRAW (*)   ? APPearance CLEAR (*)   ? Hgb urine dipstick LARGE (*)   ? RBC / HPF >50 (*)   ? All other components within normal limits  ?CBC WITH DIFFERENTIAL/PLATELET  ?POC URINE PREG, ED  ? ? ? ?EKG ? ?None ? ? ?RADIOLOGY ?I independently interpreted and visualized the CT head. My interpretation: No bleed ?Radiology interpretation:  ?IMPRESSION:  ?No acute intracranial abnormality.  ?   ?Fluid within the left middle ear cavity. 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  ? ?PROCEDURES: ? ?Critical Care performed: No ? ?Procedures ? ? ?MEDICATIONS ORDERED IN ED: ?Medications - No data to display ? ? ?IMPRESSION / MDM / ASSESSMENT AND PLAN / ED COURSE  ?I reviewed the triage vital signs and the nursing notes. ?             ?               ? ?Differential diagnosis includes, but is not limited to, intracranial process, tension headache, drug use. ? ? ?Patient presented to the emergency department today with primary concern for possible strokes although she describes more seizure-like activity.  Patient is not a good historian which could potentially be due to intoxication.  Ethanol level was quite elevated.  CT head without any concerning bleed or large intracranial mass.  Blood work showed slight hypokalemia but no real significant electrolyte abnormality or leukocytosis.  Patient did feel better here in the emergency  department and stated she did not want to be discharged home.  This point I think it is reasonable.  I do wonder if a lot of patient's symptoms are related to alcohol use.  I discussed with patient importance of cutting back on alcohol use.  Will give patient resources for that as well as neurology follow-up information. ? ? ?FINAL CLINICAL IMPRESSION(S) / ED DIAGNOSES  ? ?Final diagnoses:  ?Bad headache  ?Alcohol use  ? ? ? ?Note:  This document was prepared using Dragon voice recognition software and may include unintentional dictation errors. ? ?  ?Nance Pear, MD ?10/14/21 2325 ? ?

## 2021-10-14 NOTE — ED Notes (Signed)
When pt was brought to hallway bed. Pt refused to get into the bed. when explained why she needs to be in the bed, pt complied to go in the bed. Pt then was worried that I will get blood for her. Explained that if she does not want blood work, then I can not force her to do so. ?

## 2021-10-14 NOTE — ED Notes (Signed)
Pt started saying she can not breath. Writer when over and put pulse ox on her finger. O2 states where 99%. Sat pt's head up. Pt became upset when suecirty did not come to help her. Explained that he is not medical and would not be able to help.  ?

## 2021-10-14 NOTE — ED Notes (Signed)
Pt awake, talking on cell phone.   ?

## 2022-01-15 NOTE — Congregational Nurse Program (Signed)
  Dept: [place] Nurse Program Note  Date of Encounter: 01/15/2022 Client to clinic for vital sign check. She reports doing well, still employed at [employer], in good spirits. BP 120/68 (BP Location: Left Arm, Patient Position: Sitting, Cuff Size: Normal)   Pulse 65   SpO2 100% Comment: room air  No concerns or other needs at this time. Past Medical History: Past Medical History:  Diagnosis Date   Seizure [facility])     Encounter Details:  CNP Questionnaire - 01/15/22 1200       Questionnaire   Do you give verbal consent to treat you today? Yes    Location Patient [place] or Organization    Patient Status Unknown    Insurance Unknown    Insurance Referral Affordable Care (ACA)   through her employer   Medication N/A    Medical Provider Yes    Screening Referrals N/A    Medical Referral N/A    Medical Appointment Made N/A    Food N/A    Transportation N/A   client uses the [place] bus system   Housing/Utilities No permanent housing    Interpersonal Safety N/A    Intervention Blood pressure;Support    ED Visit Averted N/A    Life-Saving Intervention Made N/A

## 2022-02-28 ENCOUNTER — Other Ambulatory Visit: Payer: Self-pay

## 2022-02-28 ENCOUNTER — Emergency Department: Payer: Self-pay

## 2022-02-28 ENCOUNTER — Emergency Department
Admission: EM | Admit: 2022-02-28 | Discharge: 2022-02-28 | Disposition: A | Discharge status: Home / Self Care | Payer: Self-pay | Attending: Emergency Medicine | Admitting: Emergency Medicine

## 2022-02-28 DIAGNOSIS — M25531 Pain in right wrist: Secondary | ICD-10-CM | POA: Insufficient documentation

## 2022-02-28 LAB — POC URINE PREG, ED: Preg Test, Ur: NEGATIVE

## 2022-02-28 MED ORDER — NAPROXEN 500 MG PO TABS: 500.0000 mg | ORAL_TABLET | Freq: Two times a day (BID) | ORAL | 0 refills | Status: AC

## 2022-02-28 MED ORDER — ACETAMINOPHEN 500 MG PO TABS
1000.0000 mg | ORAL_TABLET | Freq: Once | ORAL | Status: AC
  Administered 2022-02-28: 1000 mg via ORAL
  Filled 2022-02-28: qty 2

## 2022-02-28 MED ORDER — IBUPROFEN 600 MG PO TABS
600.0000 mg | ORAL_TABLET | Freq: Once | ORAL | Status: AC
  Administered 2022-02-28: 600 mg via ORAL
  Filled 2022-02-28: qty 1
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
## 2022-02-28 NOTE — Discharge Instructions (Addendum)
-  You may keep the wrist brace on throughout the day for the next few weeks.  Recommend ice and rest as well for the next 24 to 48 hours. Please follow-up with the orthopedist this in these instructions if your symptoms fail to improve after 1 to 2 weeks.    -You may take Tylenol and naproxen as needed for pain.  -Return to the emergency department anytime if you begin experience any new or worsening symptoms.
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
## 2022-03-12 ENCOUNTER — Other Ambulatory Visit: Payer: Self-pay

## 2022-03-12 ENCOUNTER — Emergency Department
Admission: EM | Admit: 2022-03-12 | Discharge: 2022-03-12 | Disposition: A | Discharge status: Home / Self Care | Payer: Self-pay | Attending: Emergency Medicine | Admitting: Emergency Medicine

## 2022-03-12 DIAGNOSIS — R21 Rash and other nonspecific skin eruption: Secondary | ICD-10-CM | POA: Insufficient documentation

## 2022-03-12 MED ORDER — PREDNISONE 10 MG (21) PO TBPK: ORAL_TABLET | ORAL | 0 refills | Status: DC

## 2022-03-12 MED ORDER — DIPHENHYDRAMINE HCL 25 MG PO CAPS
50.0000 mg | ORAL_CAPSULE | Freq: Once | ORAL | Status: AC
  Administered 2022-03-12: 50 mg via ORAL
  Filled 2022-03-12: qty 2

## 2022-03-12 MED ORDER — PREDNISONE 20 MG PO TABS
60.0000 mg | ORAL_TABLET | Freq: Once | ORAL | Status: AC
Start: 2022-03-12 — End: 2022-03-12
  Administered 2022-03-12: 60 mg via ORAL
  Filled 2022-03-12: qty 3

## 2022-03-12 MED ORDER — PERMETHRIN 5 % EX CREA: 1.0000 | TOPICAL_CREAM | Freq: Once | CUTANEOUS | 0 refills | Status: AC

## 2022-03-12 NOTE — Discharge Instructions (Addendum)
You may use over-the-counter Benadryl 50 mg every 8 hours as needed for itching.   Steps to find a Primary Care Provider (PCP):  Call [phone] or [phone] to access "[place] [facility]."  2.  You may also go on the [facility] website at [email or web address]
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
## 2022-03-12 NOTE — ED Notes (Signed)
PT TAKEN STRAIGHT FROM EMS TRUCK TO DECON DUE TO PTS REPORT OF BEDBUG INFESTATION AT [place].
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
## 2022-03-13 NOTE — Congregational Nurse Program (Signed)
  Dept: [phone]   Congregational Nurse Program Note  Date of Encounter: 03/13/2022 Client to [facility] for follow up from and ED visit last evening. She has a rash to her inner thighs, arms and lower back that is causing her to itch. She was prescribed a steroid taper and topical cream. She has discarded her mattress and has washed all her bedding. The rash is very small raised bumps with erythema, almost like heat rash in appearance. She took the first does of the steroids today and has used the cream. She reports having used a new soap recently. RN was able to give her a mild soap and encouraged her to wash with luke warm water and avoid any tight clothing. RN also counseled client to complete the steroid taper as it should be helpful with the itching.  Past Medical History: Past Medical History:  Diagnosis Date   Seizure [facility])     Encounter Details:  CNP Questionnaire - 03/13/22 1030       Questionnaire   Do you give verbal consent to treat you today? Yes    Location Patient [place] or Organization    Patient Status Unknown    Insurance Unknown    Insurance Referral Affordable Care (ACA)   through her employer   Medication N/A    Medical Provider Yes    Screening Referrals N/A    Medical Referral N/A    Medical Appointment Made N/A    Food N/A    Transportation N/A   client uses the [place] bus system   Housing/Utilities No permanent housing    Interpersonal Safety N/A    Intervention Support;Educate    ED Visit Averted N/A    Life-Saving Intervention Made N/A

## 2023-01-28 IMAGING — CT CT HEAD W/O CM
4 series · 16 of 47 positions shown, 18 images · non-contrast
Comparison: None.

CLINICAL DATA: Seizure



[Series 2: head bone · axial · 0.38mm/px · z∈[+236,+264]mm · 3 of 71 slices shown]
[im 8/71  bone]
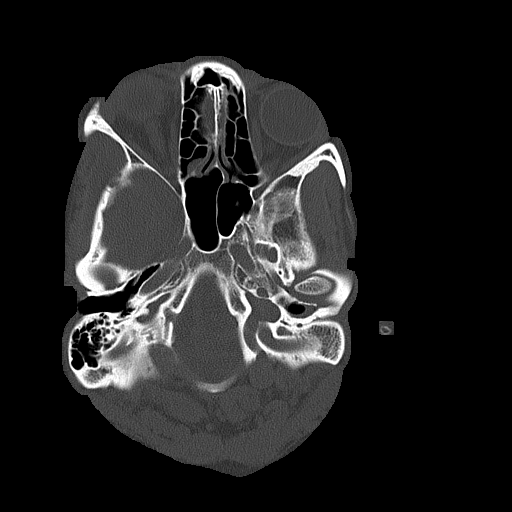
[im 15/71  bone]
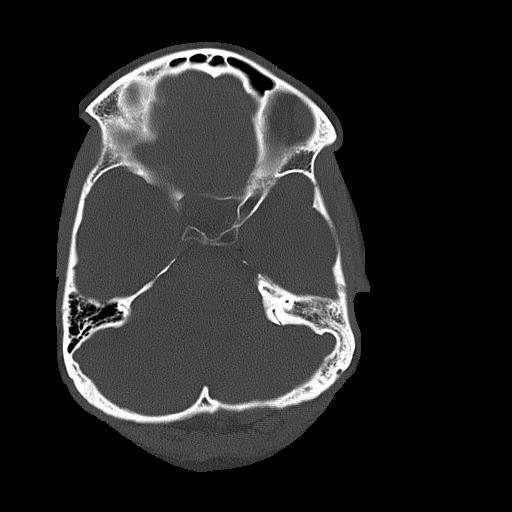
[im 22/71  bone]
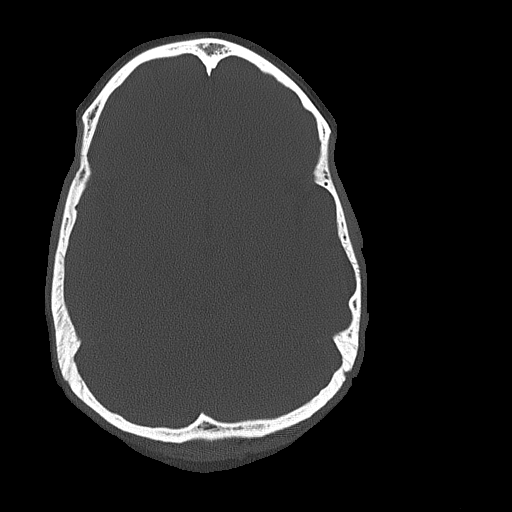

[Series 3: head wo · axial · 0.38mm/px · z∈[+237,+342]mm · 7 of 29 slices shown, 9 images]
[im 4/29  brain]
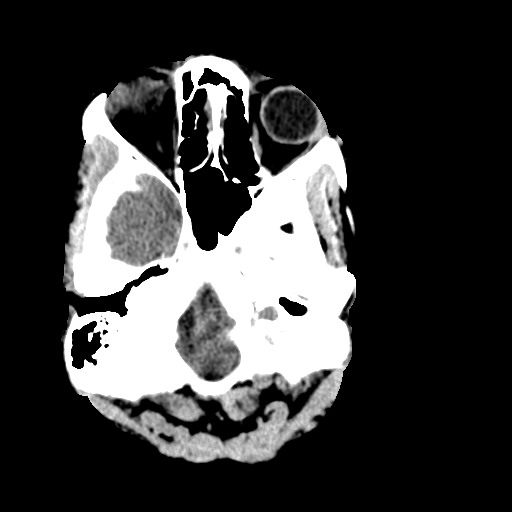
[im 4/29  bone]
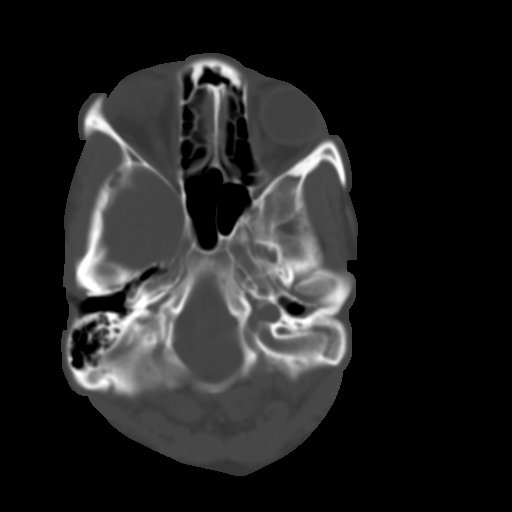
[im 8/29  brain]
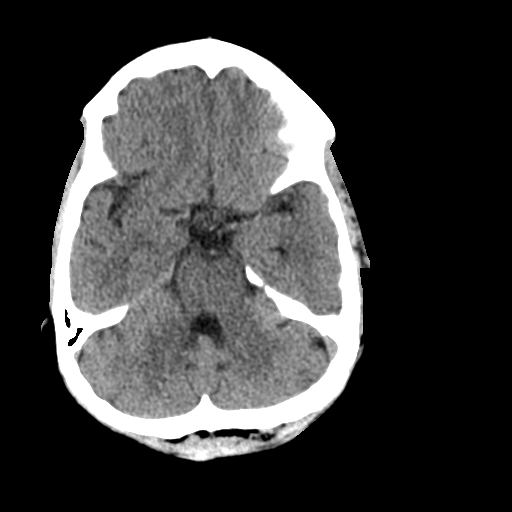
[im 11/29  brain]
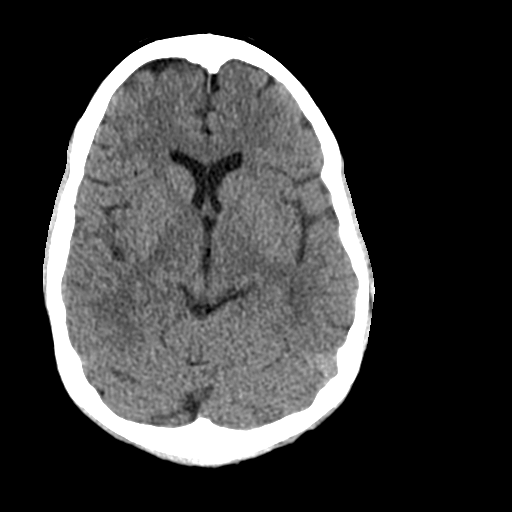
[im 15/29  brain]
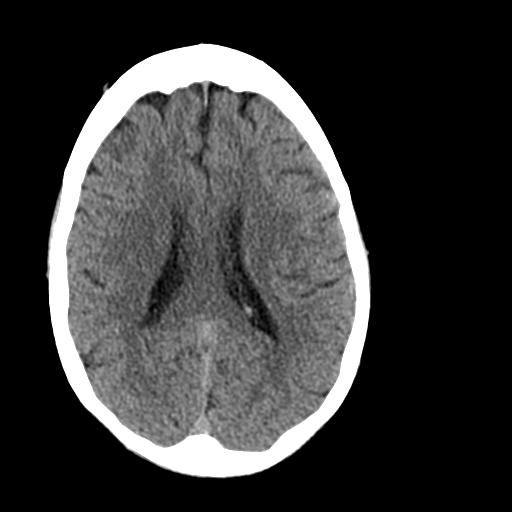
[im 18/29  brain]
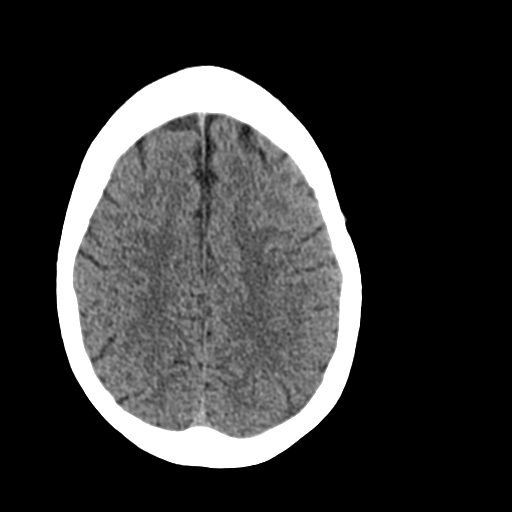
[im 18/29  bone]
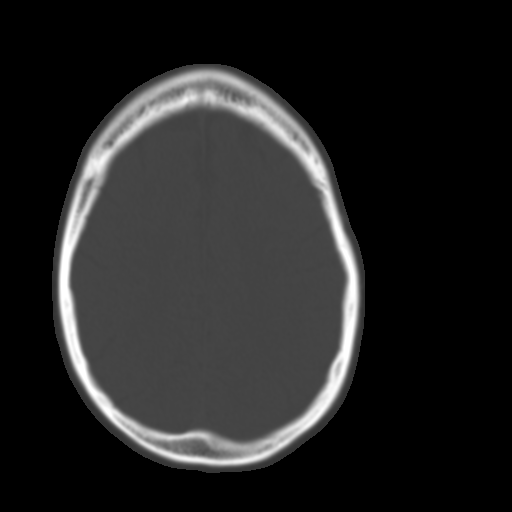
[im 22/29  brain]
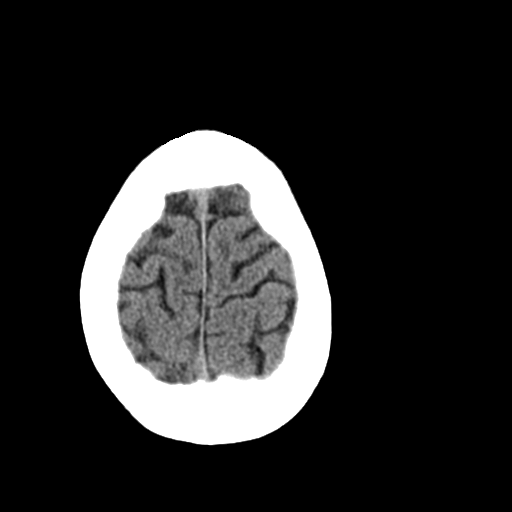
[im 25/29  brain]
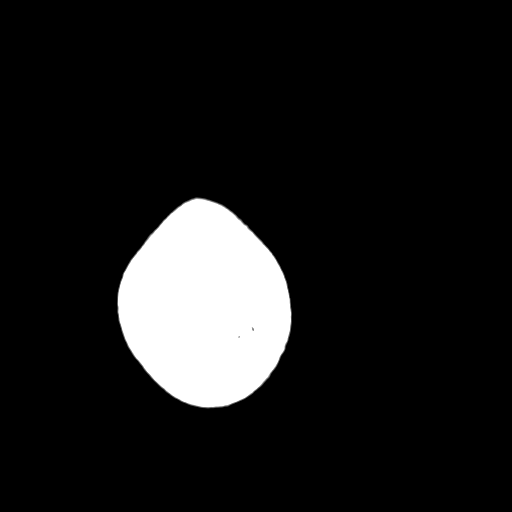

[Series 4: coronal soft tissue · coronal · 0.29mm/px · 3 of 62 slices shown]
[im 21/62  brain]
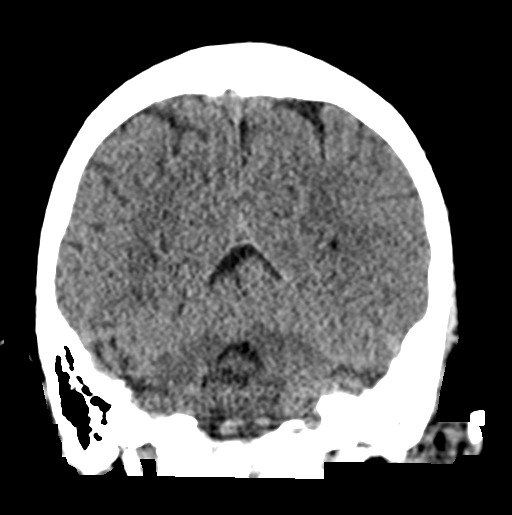
[im 28/62  brain]
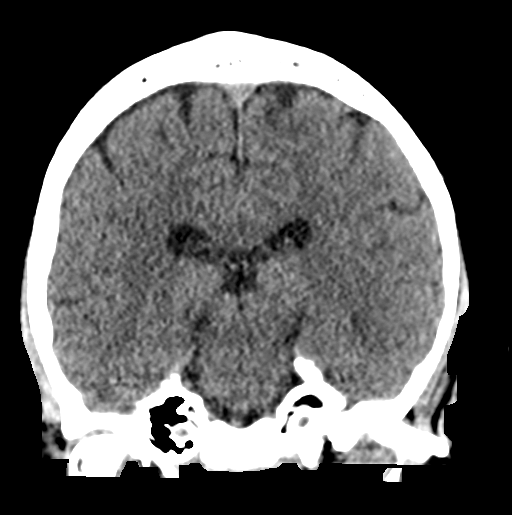
[im 34/62  brain]
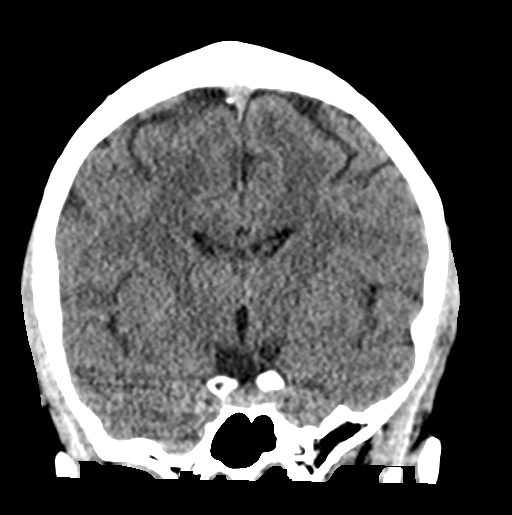

[Series 5: sagittal soft tissue · sagittal · 0.28mm/px · 3 of 50 slices shown]
[im 17/50  brain]
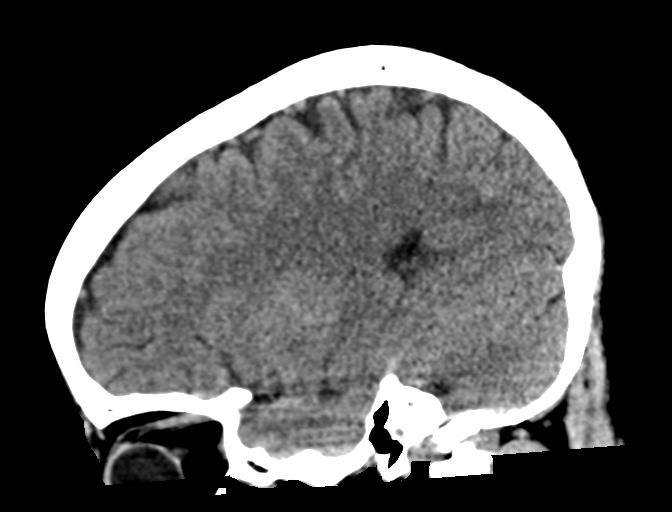
[im 25/50  brain]
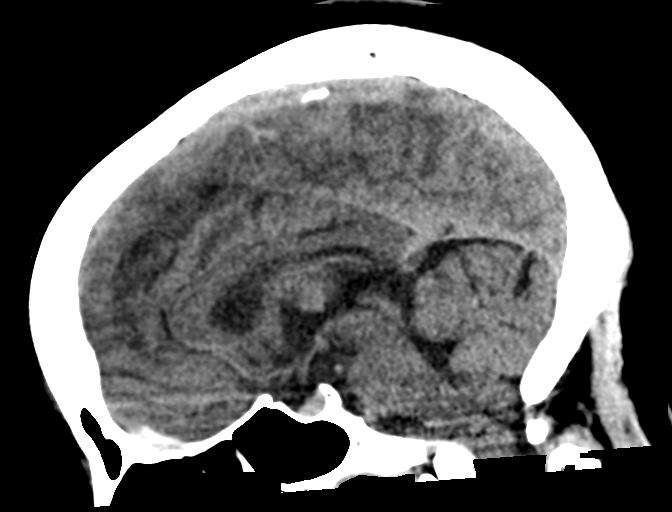
[im 33/50  brain]
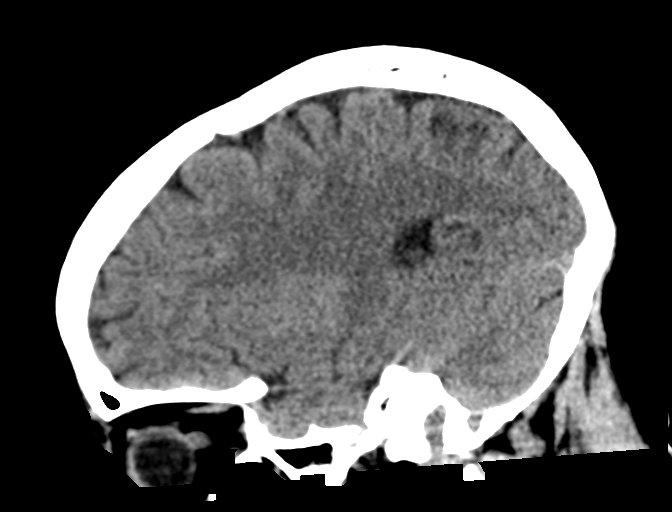

[16 of 47 positions shown; findings below may reference images not displayed]

FINDINGS: Brain: Normal anatomic configuration. No abnormal intra or
extra-axial mass lesion or fluid collection. No abnormal mass effect
or midline shift. No evidence of acute intracranial hemorrhage or
infarct. Ventricular size is normal. Cerebellum unremarkable.

Vascular: Unremarkable

Skull: Intact

Sinuses/Orbits: Paranasal sinuses are clear. Orbits are
unremarkable.

Other: Left external auditory canal appears hypoplastic and stenotic
peripherally. Small amount of fluid is seen within the external
auditory canal. There is fluid within the left middle ear cavity.
Left mastoid air cells appear hypoplastic. Right mastoid air cells
and middle ear cavities are clear.
IMPRESSION: No acute intracranial abnormality.

Fluid within the left middle ear cavity. Dysplasia of the external
auditory canal with small intraluminal fluid noted.

## 2023-03-31 NOTE — Congregational Nurse Program (Signed)
  Dept: [phone]   Congregational Nurse Program Note  Date of Encounter: 03/31/2023 Client to [facility] day center with request for housing assistance. She reports she had been living in her car, but sold it. She lost her apt last spring when she was in rehab.  She stated she had been turned away from [employer] a "couple of months ago". She does have both a steady job and local ID. RN encouraged her to return to the shelter. FH case manager was able to verify with the shelter that client could come to the shelter for an intake. Madolyn Frieze Past Medical History: Past Medical History:  Diagnosis Date   Seizure [facility])     Encounter Details:  CNP Questionnaire - 03/31/23 0915       Questionnaire   Ask client: Do you give verbal consent for me to treat you today? Yes    Student Assistance N/A    Location Patient Served  [facility]    Visit Setting with Client Organization    Patient Status Unhoused    Insurance Unknown   email to [facility] finacial navigator to inquire, client is unsure   Insurance/Financial Assistance Referral N/A   Rn to assist with medicaid application after verification of insurance status is confirmed   Medication N/A    Medical Provider Yes    Screening Referrals Made N/A    Medical Referrals Made N/A    Medical Appointment Made N/A    Recently w/o PCP, now 1st time PCP visit completed due to CNs referral or appointment made N/A    Food N/A    Transportation N/A   client uses the [place] bus system   Housing/Utilities No permanent housing;Referred to homeless shelter, day center   client had previously gone to the [employer] and was turn to be admitted   Interpersonal Safety N/A    Interventions Advocate/Support;Case Management   clarify insurance coverage   Abnormal to Normal Screening Since Last CN Visit N/A    Screenings CN Performed N/A    Sent Client to Lab for: N/A    Did client attend any of the following  based off CNs referral or appointments made? N/A    ED Visit Averted N/A    Life-Saving Intervention Made N/A

## 2024-01-07 NOTE — Congregational Nurse Program (Signed)
  Dept: [phone]   Congregational Nurse Program Note  Date of Encounter: 01/07/2024 Client to [facility] day center, nurse led clinic with request for blood pressure check. She felt stressed and was concerned. First BP reading was 145/88. Client was then seen by our volunteer [employer] practioner and second reading 1 hour later was 122/74. Client is currently employed and is working with the [employer] on her housing. Emotional support provided. Juvenal Opoka BSN, RN Past Medical History: Past Medical History:  Diagnosis Date   Seizure [facility])     Encounter Details:  Community Questionnaire - 01/07/24 0930       Questionnaire   Ask client: Do you give verbal consent for me to treat you today? Yes    Actuary Nurse    Location Patient Served  [facility]    Encounter Setting CN site    Population Status Unhoused    [place]   email to [facility] finacial navigator to inquire, client is unsure   Insurance/Financial Assistance Referral N/A   Rn to assist with medicaid application after verification of insurance status is confirmed   Medication N/A    Medical Provider No    Screening Referrals Made N/A    Medical Referrals Made N/A    Medical Appointment Completed N/A    CNP Interventions Advocate/Support;Case Management   clarify insurance coverage   Screenings CN Performed Blood Pressure    ED Visit Averted N/A    Life-Saving Intervention Made N/A

## 2024-01-14 NOTE — Congregational Nurse Program (Signed)
  Dept: [phone]   Congregational Nurse Program Note  Date of Encounter: 01/14/2024 Client to [facility] day center, nurse led clinic, with complaints of significant congestion and facial pain. Client wanted to be seen at the [facility] ED, but was agreeable to and UC visit. RN was able to make an appointment at [facility] Urgent care for 10:30 am. RN arranged transportation through [place] as client felt too poorly to take the [place] bus. Client was seen at [facility]. She then returned to [facility] with her paperwork.  She was prescribed Augmentin . For a sinus and ear infection. RN provided education regarding the importance of fluid intake and taking the medication with food. She will pick up medication this afternoon. ED visit averted. Juvenal Opoka BSN, RN Past Medical History: Past Medical History:  Diagnosis Date   Seizure [facility])     Encounter Details:  Community Questionnaire - 01/14/24 0910       Questionnaire   Ask client: Do you give verbal consent for me to treat you today? Yes    Actuary Nurse    Location Patient Served  [facility]    Encounter Setting CN site    Population Status Unhoused    Insurance [place]   email to [facility] finacial navigator to inquire, client is unsure   Insurance/Financial Assistance Referral N/A   Rn to assist with medicaid application after verification of insurance status is confirmed   Medication N/A    Medical Provider No    Screening Referrals Made N/A    Medical Referrals Made Urgent Care    Medical Appointment Completed Urgent Care    CNP Interventions Advocate/Support;Case Management;Navigate Healthcare System   clarify insurance coverage   Screenings CN Performed Blood Pressure    ED Visit Averted Yes   client with severe congestion/facial pain, wanted to go to the ED, RN made an appointment at Urgent care, client was seen and given an rx for abt   Life-Saving Intervention Made N/A

## 2024-03-30 NOTE — Congregational Nurse Program (Signed)
  Dept: [phone]   Congregational Nurse Program Note  Date of Encounter: 03/30/2024 Client to [facility] day center, nurse led clinic with concerns regarding drainage from her left ear. She reports that it drains when she eats. Client used a cotton swab while at this visit, drainage is golden yellow, she reports odor as well. Some pain when the wind hits it. Client has a history of problems with this ear, including surgery x 3 as a child. RN discussed an appointment with an ENT, she declined today. RN also encouraged client to be seen at the [facility]gent care on [address], she also declined and wanted to wait until her next day off. RN strongly encouraged client to be seen earlier that next week. Client agreed to be seen earlier if her symptoms worsen. Rn is also working with client to get established with a PCP. MARLA Marina BSN, RN Past Medical History: Past Medical History:  Diagnosis Date   Seizure [facility])     Encounter Details:  Community Questionnaire - 03/30/24 1027       Questionnaire   Ask client: Do you give verbal consent for me to treat you today? Yes    Actuary Nurse    Location Patient Served  [facility]    Encounter Setting CN site    Population Status Unknown   client is now securely housed in her own apartment   Insurance Medicaid   email to [facility] finacial navigator to inquire, client is unsure   Insurance/Financial Assistance Referral N/A   Rn to assist with medicaid application after verification of insurance status is confirmed   Medication N/A    Medical Provider No    Screening Referrals Made Annual Wellness Visit    Medical Referrals Made Urgent Care    Medical Appointment Completed N/A    CNP Interventions Advocate/Support;Navigate Healthcare System   clarify insurance coverage   Screenings CN Performed N/A    ED Visit Averted N/A   client with severe congestion/facial pain, wanted to go to the ED, RN made an appointment at  Urgent care, client was seen and given an rx for abt   Life-Saving Intervention Made N/A

## 2024-05-18 NOTE — Congregational Nurse Program (Signed)
  Dept: [phone]   Congregational Nurse Program Note  Date of Encounter: 05/18/2024 Client to [facility] with request for assistance with making an appointment for Primary care. Client was agreeable for RN to set up an appointment with [name]. This will be a virtual appointment on Nov 5th at 10;00. Client notified. MARLA Marina RN, BSN Past Medical History: Past Medical History:  Diagnosis Date   Seizure [facility])     Encounter Details:  Community Questionnaire - 05/18/24 1214       Questionnaire   Ask client: Do you give verbal consent for me to treat you today? Yes    Student Assistance N/A    Location Patient Served  [facility]    Encounter Setting CN site    Population Status Unknown   client is now securely housed in her own apartment   Insurance Medicaid   email to [facility] finacial navigator to inquire, client is unsure   Insurance/Financial Assistance Referral N/A   Rn to assist with medicaid application after verification of insurance status is confirmed   Medication N/A    Medical Provider No    Screening Referrals Made Annual Wellness Visit    Medical Referrals Made [facility] PCP/Clinic    Medical Appointment Completed N/A    CNP Interventions Advocate/Support;Navigate Healthcare System;Educate   clarify insurance coverage   Screenings CN Performed N/A    ED Visit Averted N/A   client with severe congestion/facial pain, wanted to go to the ED, RN made an appointment at Urgent care, client was seen and given an rx for abt   Life-Saving Intervention Made N/A

## 2024-05-25 NOTE — Congregational Nurse Program (Signed)
  Dept: [phone]   Congregational Nurse Program Note  Date of Encounter: 05/25/2024 Client to [facility]ionate care center with complaints of a tooth ache. She states she has missed work the past 2 days. RN contacted [employer] [phone]), Client was offered an appointment this afternoon, but was unable, do to her work schedule today, to take it. Apt made for Friday 10/31 at 1 pm. Client aware and has the number for the dental office. RN encouraged client to seek medical attention if her pain increased or she had a fever. MARLA Marina BSN, RN Past Medical History: Past Medical History:  Diagnosis Date   Seizure [facility])     Encounter Details:  Community Questionnaire - 05/25/24 0848       Questionnaire   Ask client: Do you give verbal consent for me to treat you today? Yes    Student Assistance N/A    Location Patient Served  [facility]    Encounter Setting CN site    Population Status Unknown   client is now securely housed in her own apartment   Insurance Medicaid   email to [facility] finacial navigator to inquire, client is unsure   Insurance/Financial Assistance Referral N/A   Rn to assist with medicaid application after verification of insurance status is confirmed   Medication N/A    Medical Provider No    Screening Referrals Made Annual Wellness Visit    Medical Referrals Made [facility] PCP/Clinic;Dental    Medical Appointment Completed N/A    CNP Interventions Advocate/Support;Navigate Healthcare System;Educate   clarify insurance coverage   Screenings CN Performed N/A    ED Visit Averted N/A   client with severe congestion/facial pain, wanted to go to the ED, RN made an appointment at Urgent care, client was seen and given an rx for abt   Life-Saving Intervention Made N/A

## 2024-05-27 ENCOUNTER — Other Ambulatory Visit: Payer: Self-pay

## 2024-05-27 MED ORDER — AMOXICILLIN 500 MG PO CAPS
500.0000 mg | ORAL_CAPSULE | Freq: Three times a day (TID) | ORAL | 0 refills | Status: AC
  Filled 2024-05-27: qty 21, 7d supply, fill #0

## 2024-06-01 ENCOUNTER — Telehealth: Admitting: Nurse Practitioner

## 2024-06-01 ENCOUNTER — Other Ambulatory Visit: Payer: Self-pay | Admitting: Nurse Practitioner

## 2024-06-01 VITALS — BP 111/75 | HR 66 | Resp 16 | Ht 62.21 in | Wt 140.6 lb

## 2024-06-01 DIAGNOSIS — R0683 Snoring: Secondary | ICD-10-CM

## 2024-06-01 DIAGNOSIS — Z113 Encounter for screening for infections with a predominantly sexual mode of transmission: Secondary | ICD-10-CM

## 2024-06-01 DIAGNOSIS — Z803 Family history of malignant neoplasm of breast: Secondary | ICD-10-CM

## 2024-06-01 DIAGNOSIS — Z720 Tobacco use: Secondary | ICD-10-CM

## 2024-06-01 DIAGNOSIS — R062 Wheezing: Secondary | ICD-10-CM | POA: Diagnosis not present

## 2024-06-01 DIAGNOSIS — N6313 Unspecified lump in the right breast, lower outer quadrant: Secondary | ICD-10-CM

## 2024-06-01 DIAGNOSIS — R928 Other abnormal and inconclusive findings on diagnostic imaging of breast: Secondary | ICD-10-CM

## 2024-06-01 DIAGNOSIS — Z1321 Encounter for screening for nutritional disorder: Secondary | ICD-10-CM

## 2024-06-01 DIAGNOSIS — R0602 Shortness of breath: Secondary | ICD-10-CM | POA: Diagnosis not present

## 2024-06-01 MED ORDER — NICOTINE POLACRILEX 4 MG MT GUM: 4.0000 mg | CHEWING_GUM | OROMUCOSAL | 0 refills | Status: DC | PRN

## 2024-06-01 MED ORDER — SPACER/AERO-HOLDING CHAMBERS DEVI: 1.0000 | 0 refills | Status: AC | PRN

## 2024-06-01 MED ORDER — ALBUTEROL SULFATE HFA 108 (90 BASE) MCG/ACT IN AERS: 2.0000 | INHALATION_SPRAY | Freq: Four times a day (QID) | RESPIRATORY_TRACT | 0 refills | Status: AC | PRN

## 2024-06-01 MED ORDER — NICOTINE POLACRILEX 4 MG MT LOZG: 4.0000 mg | LOZENGE | OROMUCOSAL | 0 refills | Status: AC | PRN

## 2024-06-01 NOTE — Addendum Note (Signed)
 Addended by: KENNYTH DOMINO E on: 06/01/2024 12:13 PM   Modules accepted: Orders

## 2024-06-01 NOTE — Patient Instructions (Addendum)
 Call to schedule Mammogram [phone]  Pulmonology referral placed toady

## 2024-06-01 NOTE — Congregational Nurse Program (Signed)
  Dept: [phone]   Congregational Nurse Program Note  Date of Encounter: 06/01/2024 Client to [facility] to [facility]. RN reviewed medications and referrals made. Client has already received a call from [facility] and has an appointment scheduled for 12/10 at 10 am. [facility] will contact client when all the orders are received for her mammogram and ultrasound. New medications include and albuterol inhaler and nicotine lozenges to help curb her smoking. Client to pick up at [facility] on [address]. Plan is for follow up visit [facility] in one Month. Client is aware that [name], NP will be in contact with her regarding her lab results. Client very appreciative of care and support. MARLA Marina BSN, RN Past Medical History: Past Medical History:  Diagnosis Date   Seizure [facility])     Encounter Details:  Community Questionnaire - 06/01/24 1215       Questionnaire   Ask client: Do you give verbal consent for me to treat you today? Yes    Student Assistance N/A    Location Patient Served  [facility]    Encounter Setting CN site    Population Status Unknown   client is now securely housed in her own apartment   Insurance Medicaid   email to [facility] finacial navigator to inquire, client is unsure   Insurance/Financial Assistance Referral N/A   Rn to assist with medicaid application after verification of insurance status is confirmed   Medication N/A    Medical Provider Yes    Screening Referrals Made Breast;Lung    Medical Referrals Made [facility] PCP[facility]inic    Medical Appointment Completed [facility] PCP/Clinic    CNP Interventions Advocate/Support;Navigate Healthcare System;Educate;Case Management   clarify insurance coverage   Screenings CN Performed N/A    ED Visit Averted N/A   client with severe congestion/facial pain, wanted to go to the ED, RN made an appointment at Urgent  care, client was seen and given an rx for abt   Life-Saving Intervention Made N/A

## 2024-06-01 NOTE — Progress Notes (Signed)
 Pt presents for possible sleep apnea  -states few weeks ago cousin stated over and noticed she stopped breathing while she was sleeping -has seen PCP in years

## 2024-06-01 NOTE — Progress Notes (Signed)
 Virtual Primary Care Telehealth Visit  Virtual Visit Consent  [name], you are scheduled for a virtual visit with a [facility] provider today. Just as with appointments in the office, your consent must be obtained to participate. Your consent will be active for this visit and any virtual visit you may have with one of our providers in the next 365 days. If you have a MyChart account, a copy of this consent can be sent to you electronically.  By engaging in this virtual visit, you consent to the provision of healthcare and authorize for your insurance to be billed (if applicable) for the services provided during this visit. Depending on your insurance coverage, you may receive a charge related to this service.  I need to obtain your verbal consent now. Are you willing to proceed with your visit today? [name] has provided verbal consent on 06/01/2024 for a virtual visit (via Tytocare). [name], FNP  Date: 06/01/2024 10:31 AM  Virtual Visit via Video Note   I, [name], connected with  Kendra Potts  ([id number], Nov 14, 1985) on 06/01/24 at 10:00 AM EST by a video-enabled telemedicine application and verified that I am speaking with the correct person using two identifiers.  Telepresenter, Berlinda Pouch, present for entirety of visit to assist with video functionality and physical examination via TytoCare device.   This is an initial visit to discuss the opportunity to become a primary care patient at [facility]  The patient understands that if their medical background is complex, their case will be reviewed with the Medical Director, and if Virtual Primary Care is not the ideal location for their care, our team will help establish the patient with a primary care provider in the area.   If this is determined that this location is not the best option for the patient, in the future if the patient's medical condition changes we can re explore the option of this location  serving as their primary care location.  The patient understands that by becoming a primary care patient, this would be the location for their primary care, and if they chose to leave this location and seek primary care services at another location they will not be able to continue their relationship with this clinic.    Location: Patient: Virtual Visit Location Patient: VPC visit location: [facility]  Provider: Virtual Visit Location Provider: Home Office   History of Present Illness: Kendra Potts is a 38 y.o. who identifies as a female who was assigned female at birth, and is being seen today as a new patient with the [facility].   Her main health concern today is breathing difficulty, snoring and concern of apnea while sleeping. She was at her cousin's house recently and was notified that she would go long periods without breathing in her sleep. Patient is aware that she does snore and will wake herself up snoring at times. Does admit to daytime sleepiness though she does not take naps frequently because she works a lot  Patient also notes she has wheezing and SOB at times especially with exertion. 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  Has been doing better but admits she does have alcohol at times    She has not had a primary care provider in recent years has  frequented ED for health needs   Patient has a palpable mass under right breast that has been present for awhile says that it is tender at times. She has not notified anyone because she is concerned over what it could be.  Family history of breast CA in mother died at age 81    HPI:  Problems: There are no active problems to display for this patient.   Allergies:  Allergies  Allergen Reactions   Percocet [Oxycodone -Acetaminophen ]    Medications: None    Observations/Objective: Physical Exam Constitutional:      General: She is not in acute distress.    Appearance: Normal appearance. She is not ill-appearing.  HENT:     Nose: Nose normal.     Mouth/Throat:     Mouth: Mucous membranes are moist.  Cardiovascular:     Rate and Rhythm: Normal rate and regular rhythm.     Heart sounds: Normal heart sounds.  Pulmonary:     Effort: Pulmonary effort is normal.     Breath sounds: Examination of the right-upper field reveals wheezing. Examination of the left-upper field reveals wheezing. Wheezing present. No rhonchi.  Musculoskeletal:     Cervical back: Neck supple.  Neurological:     Mental Status: She is alert and oriented to person, place, and time.  Psychiatric:        Mood and Affect: Mood normal.     Today's Vitals   06/01/24 1005  Weight: 140 lb 9.6 oz (63.8 kg)  Height: 5' 2.21 (1.58 m)   Body mass index is 25.55 kg/m.   Assessment and Plan:  Will followup with lab results when available  Primary health focus for today is breast mass workup and respiratory concerns    1. Family history of breast cancer (Primary)  - MM 3D DIAGNOSTIC MAMMOGRAM BILATERAL BREAST; Future - CBC with Differential/Platelet - Comprehensive metabolic panel with GFR - Hemoglobin A1c - Lipid panel - TSH - VITAMIN D 25 Hydroxy (Vit-D Deficiency, Fractures) - HIV Antibody (routine testing w rflx) - Hepatitis C antibody, reflex  2. Encounter for vitamin deficiency screening  - CBC with Differential/Platelet - Comprehensive metabolic panel with GFR - Hemoglobin A1c - Lipid panel - TSH - VITAMIN D 25 Hydroxy (Vit-D Deficiency, Fractures)  3. Screening examination for sexually transmitted disease  - HIV Antibody (routine testing w rflx) - Hepatitis C antibody, reflex  4. Tobacco abuse  - Ambulatory referral to Pulmonology - nicotine polacrilex (NICORETTE) 4 MG gum; Take 1 each (4 mg total) by mouth as needed for smoking cessation.   Dispense: 100 tablet; Refill: 0  5. Snoring  - Ambulatory referral to Sleep Studies - Ambulatory referral to Pulmonology  6. Shortness of breath  - Ambulatory referral to Pulmonology  7. Wheezing  - Ambulatory referral to Pulmonology - albuterol (VENTOLIN HFA) 108 (90 Base) MCG/ACT inhaler; Inhale 2 puffs into the lungs every 6 (six) hours as needed for wheezing or shortness of breath.  Dispense: 8 g; Refill: 0 - Spacer/Aero-Holding Chambers DEVI; Take 1 Container by mouth every 4 (four) hours as needed (to use with Albuterol inhaler).  Dispense: 1 Box; Refill: 0  8. Mass of lower outer quadrant of right breast  - MM 3D DIAGNOSTIC MAMMOGRAM BILATERAL BREAST; Future   Follow Up Instructions: I discussed the assessment and treatment plan with the patient. The Telepresenter provided patient with a physical copy of my written instructions for review.   The patient was advised to call back or seek an  in-person evaluation if the symptoms worsen or if the condition fails to improve as anticipated.    [name], FNP  **Disclaimer: This note may have been dictated with voice recognition software. Similar sounding words can inadvertently be transcribed and this note may contain transcription errors which may not have been corrected upon publication of note.**

## 2024-06-01 NOTE — Congregational Nurse Program (Unsigned)
  Dept: [phone]   Congregational Nurse Program Note  Date of Encounter: 06/01/2024 came to clinic- blood pressure screening,  states she is concerned about her breathing with sleeping. States stayed with her cousin  which noticed that she was stopping breathing when she was sleeping. Also concerned about a knot under her right breast. Set her up with virtual care appointment for provider follow up. She does state that she is currently on antibiotics for her wisdom teeth. She is planning on having 3 wisdom teeth pulled within the next month. Vital signs: 111/75, 66 hr, resp 16, 98% o2 Past Medical History: Past Medical History:  Diagnosis Date   Seizure [facility])     Encounter Details:  Community Questionnaire - 06/01/24 1032       Questionnaire   Ask client: Do you give verbal consent for me to treat you today? Yes    Student Assistance N/A    Location Patient Served  [facility]    Encounter Setting CN site    Population Status Unknown    [employer] or [place] Insurance    Insurance/Financial Assistance Referral N/A    Medication N/A    Medical Provider No   [place] virtual care referral   Screening Referrals Made N/A    Medical Referrals Made [facility]    Medical Appointment Completed [facility]    CNP Interventions Navigate Healthcare System    Screenings CN Performed Blood Pressure    ED Visit Averted N/A    Life-Saving Intervention Made N/A

## 2024-06-02 LAB — COMPREHENSIVE METABOLIC PANEL WITH GFR
ALT: 16 IU/L (ref 0–32)
AST: 23 IU/L (ref 0–40)
Albumin: 4.5 g/dL (ref 3.9–4.9)
Alkaline Phosphatase: 76 IU/L (ref 41–116)
BUN/Creatinine Ratio: 18 (ref 9–23)
BUN: 11 mg/dL (ref 6–20)
Bilirubin Total: 0.3 mg/dL (ref 0.0–1.2)
CO2: 20 mmol/L (ref 20–29)
Calcium: 9.3 mg/dL (ref 8.7–10.2)
Chloride: 103 mmol/L (ref 96–106)
Creatinine, Ser: 0.6 mg/dL (ref 0.57–1.00)
Globulin, Total: 2.4 g/dL (ref 1.5–4.5)
Glucose: 79 mg/dL (ref 70–99)
Potassium: 4.5 mmol/L (ref 3.5–5.2)
Sodium: 137 mmol/L (ref 134–144)
Total Protein: 6.9 g/dL (ref 6.0–8.5)
eGFR: 119 mL/min/1.73 (ref >59)

## 2024-06-02 LAB — CBC WITH DIFFERENTIAL/PLATELET
Basophils Absolute: 0 x10E3/uL (ref 0.0–0.2)
Basos: 0 %
EOS (ABSOLUTE): 0 x10E3/uL (ref 0.0–0.4)
Eos: 1 %
Hematocrit: 44.9 % (ref 34.0–46.6)
Hemoglobin: 15 g/dL (ref 11.1–15.9)
Immature Grans (Abs): 0 x10E3/uL (ref 0.0–0.1)
Immature Granulocytes: 0 %
Lymphocytes Absolute: 1.8 x10E3/uL (ref 0.7–3.1)
Lymphs: 34 %
MCH: 30.6 pg (ref 26.6–33.0)
MCHC: 33.4 g/dL (ref 31.5–35.7)
MCV: 92 fL (ref 79–97)
Monocytes Absolute: 0.3 x10E3/uL (ref 0.1–0.9)
Monocytes: 5 %
Neutrophils Absolute: 3.3 x10E3/uL (ref 1.4–7.0)
Neutrophils: 60 %
Platelets: 325 x10E3/uL (ref 150–450)
RBC: 4.9 x10E6/uL (ref 3.77–5.28)
RDW: 13.1 % (ref 11.7–15.4)
WBC: 5.4 x10E3/uL (ref 3.4–10.8)

## 2024-06-02 LAB — TSH: TSH: 0.305 u[IU]/mL — ABNORMAL LOW (ref 0.450–4.500)

## 2024-06-02 LAB — HEMOGLOBIN A1C
Est. average glucose Bld gHb Est-mCnc: 117 mg/dL
Hgb A1c MFr Bld: 5.7 % — ABNORMAL HIGH (ref 4.8–5.6)

## 2024-06-02 LAB — LIPID PANEL
Chol/HDL Ratio: 2.8 ratio (ref 0.0–4.4)
Cholesterol, Total: 188 mg/dL (ref 100–199)
HDL: 68 mg/dL (ref >39)
LDL Chol Calc (NIH): 109 mg/dL — ABNORMAL HIGH (ref 0–99)
Triglycerides: 59 mg/dL (ref 0–149)
VLDL Cholesterol Cal: 11 mg/dL (ref 5–40)

## 2024-06-02 LAB — VITAMIN D 25 HYDROXY (VIT D DEFICIENCY, FRACTURES): Vit D, 25-Hydroxy: 8.9 ng/mL — ABNORMAL LOW (ref 30.0–100.0)

## 2024-06-02 LAB — HIV ANTIBODY (ROUTINE TESTING W REFLEX): HIV Screen 4th Generation wRfx: NONREACTIVE

## 2024-06-03 ENCOUNTER — Ambulatory Visit: Payer: Self-pay | Admitting: Nurse Practitioner

## 2024-06-03 ENCOUNTER — Telehealth: Payer: Self-pay | Admitting: Nurse Practitioner

## 2024-06-03 LAB — TSH+T4F+T3FREE
Free T4: 1.31 ng/dL (ref 0.82–1.77)
T3, Free: 4 pg/mL (ref 2.0–4.4)
TSH: 0.315 u[IU]/mL — ABNORMAL LOW (ref 0.450–4.500)

## 2024-06-03 LAB — SPECIMEN STATUS REPORT

## 2024-06-03 NOTE — Telephone Encounter (Signed)
 Attempted to reach patient to discuss lab results. Will send Mychart message

## 2024-06-06 ENCOUNTER — Other Ambulatory Visit: Payer: Self-pay

## 2024-06-06 ENCOUNTER — Ambulatory Visit
Admission: RE | Admit: 2024-06-06 | Discharge: 2024-06-06 | Disposition: A | Discharge status: Home / Self Care | Source: Ambulatory Visit | Attending: Nurse Practitioner | Admitting: Nurse Practitioner

## 2024-06-06 ENCOUNTER — Other Ambulatory Visit: Payer: Self-pay | Admitting: Nurse Practitioner

## 2024-06-06 DIAGNOSIS — Z803 Family history of malignant neoplasm of breast: Secondary | ICD-10-CM | POA: Diagnosis present

## 2024-06-06 DIAGNOSIS — N6313 Unspecified lump in the right breast, lower outer quadrant: Secondary | ICD-10-CM | POA: Insufficient documentation

## 2024-06-06 DIAGNOSIS — R928 Other abnormal and inconclusive findings on diagnostic imaging of breast: Secondary | ICD-10-CM | POA: Insufficient documentation

## 2024-06-06 DIAGNOSIS — E559 Vitamin D deficiency, unspecified: Secondary | ICD-10-CM

## 2024-06-06 MED ORDER — VITAMIN D (ERGOCALCIFEROL) 1.25 MG (50000 UNIT) PO CAPS
50000.0000 [IU] | ORAL_CAPSULE | ORAL | 0 refills | Status: AC
  Filled 2024-06-06: qty 8, 56d supply, fill #0

## 2024-06-06 NOTE — Progress Notes (Signed)
 Rx for Vitamin D sent to [facility] pharmacy instructions discussed with patient   1. Vitamin D deficiency (Primary)  - Vitamin D, Ergocalciferol, (DRISDOL) 1.25 MG (50000 UNIT) CAPS capsule; Take 1 capsule (50,000 Units total) by mouth every 7 (seven) days.  Dispense: 8 capsule; Refill: 0

## 2024-06-08 ENCOUNTER — Encounter

## 2024-06-08 ENCOUNTER — Other Ambulatory Visit

## 2024-06-14 NOTE — Congregational Nurse Program (Signed)
  Dept: [phone]   Congregational Nurse Program Note  Date of Encounter: 06/14/2024 Client to [facility] center  with request for assistance in arranging Medicaid transportation for her dental appointment on 10/21. Transportation arranged through [employer] # [phone]. Client to be picked up at her apartment and returned home. Client does have a working phone and will be contacted by [employer] with ride details. MARLA Marina BSN, RN  Past Medical History: Past Medical History:  Diagnosis Date   Seizure [facility])     Encounter Details:  Community Questionnaire - 06/14/24 0931       Questionnaire   Ask client: Do you give verbal consent for me to treat you today? Yes    Student Assistance N/A    Location Patient Served  [facility]    Encounter Setting CN site    Population Status Unknown   client is now securely housed in her own apartment   Insurance Medicaid   email to [facility] finacial navigator to inquire, client is unsure   Insurance/Financial Assistance Referral N/A   Rn to assist with medicaid application after verification of insurance status is confirmed   Medication N/A    Medical Provider Yes    Screening Referrals Made N/A    Medical Referrals Made [facility]    Medical Appointment Completed [facility] PCP/Clinic    CNP Interventions Advocate/Support;Navigate Healthcare System;Case Management   clarify insurance coverage   Screenings CN Performed N/A    ED Visit Averted N/A   client with severe congestion/facial pain, wanted to go to the ED, RN made an appointment at Urgent care, client was seen and given an rx for abt   Life-Saving Intervention Made N/A

## 2024-06-15 ENCOUNTER — Telehealth: Admitting: Nurse Practitioner

## 2024-06-15 VITALS — BP 127/85 | HR 70 | Resp 16 | Ht 62.0 in | Wt 150.2 lb

## 2024-06-15 DIAGNOSIS — H6121 Impacted cerumen, right ear: Secondary | ICD-10-CM

## 2024-06-15 DIAGNOSIS — K5900 Constipation, unspecified: Secondary | ICD-10-CM

## 2024-06-15 DIAGNOSIS — H60392 Other infective otitis externa, left ear: Secondary | ICD-10-CM | POA: Diagnosis not present

## 2024-06-15 MED ORDER — POLYETHYLENE GLYCOL 3350 17 GM/SCOOP PO POWD: 17.0000 g | Freq: Every day | ORAL | 1 refills | Status: AC | PRN

## 2024-06-15 MED ORDER — CIPROFLOXACIN-DEXAMETHASONE 0.3-0.1 % OT SUSP
4.0000 [drp] | Freq: Two times a day (BID) | OTIC | 0 refills | Status: AC
Start: 2024-06-15 — End: ?

## 2024-06-15 MED ORDER — DEBROX 6.5 % OT SOLN: 5.0000 [drp] | Freq: Two times a day (BID) | OTIC | 0 refills | Status: AC

## 2024-06-15 NOTE — Progress Notes (Signed)
 Pt presents for follow-up -complains of ear pain

## 2024-06-15 NOTE — Progress Notes (Signed)
 Acute Office Visit  Virtual Visit Consent:   Kendra Potts, you are scheduled for a virtual visit with a [facility] provider today.     Just as with appointments in the office, your consent must be obtained to participate.  Your consent will be active for this visit and any virtual visit you may have with one of our providers in the next 365 days.     If you have a MyChart account, a copy of this consent can be sent to you electronically.  All virtual visits are billed to your insurance company just like a traditional visit in the office.    If the connection with a video visit is poor, the visit may have to be switched to a telephone visit.  With either a video or telephone visit, we are not always able to ensure that we have a secure connection.     I need to obtain your verbal consent now.   Are you willing to proceed with your visit today?    BOWEN GOYAL has provided verbal consent on 06/15/2024 for a virtual visit (video or telephone).   [name], FNP  Date: 06/15/2024 9:51 AM  Subjective:     Patient ID: Kendra Potts, female    DOB: 12-07-1985, 38 y.o.   MRN: [id number]  Kendra Potts [name], connected with  Kendra Potts  ([id number], January 04, 1986) on 06/15/24 at  9:00 AM EST by a video-enabled telemedicine application and verified that I am speaking with the correct person using two identifiers.  Zackary Berlinda Pouch , present for entirety of visit to assist with video functionality and physical examination via TytoCare device.  Location: Patient: [facility]  Provider: Virtual Visit Location Provider: Home Office   I discussed the limitations of evaluation and management by telemedicine and the availability of in person appointments. The patient expressed understanding and agreed to proceed.      HPI  Kendra Potts is a 38 y.o. who identifies as a female who was assigned female at birth, and is being seen today for follow up.  She has had drainage  from ears with an odor, mainly her left ear, also complaining of constipation since taking antibiotics for an dental infection recently. She has an upcoming follow up with [place] [facility] specialist in [place] to address her dental needs. Denies any fever or systemic signs of infection toady   Last bowel movement yesterday not fully relieved - she got something from [employer] to try   History is significant for surgical tube placement as a child (when living in [place] )  has continued to have ear problems since childhood. 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   HENT:  Positive for ear discharge and ear pain.   Eyes: Negative.   Respiratory: Negative.    Cardiovascular: Negative.   Gastrointestinal:  Positive for constipation.  Genitourinary: Negative.   Musculoskeletal: Negative.   Skin: Negative.         Objective:    BP 127/85 (BP Location: Left Arm, Patient Position: Sitting, Cuff Size: Normal)   Pulse 70   Resp 16   Ht 5' 2 (1.575 m)   Wt 150 lb 3.2 oz (68.1 kg)   LMP 05/28/2024   SpO2 98%   BMI 27.47 kg/m   Today's Vitals   06/15/24 0931  BP: 127/85  Pulse: 70  Resp: 16  SpO2: 98%  Weight: 150 lb 3.2 oz (68.1 kg)  Height: 5' 2 (1.575 m)  PainSc: 3    Body mass index is 27.47 kg/m.   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  Other infective acute otitis externa of left ear (Primary)  - ciprofloxacin -dexamethasone  (CIPRODEX ) OTIC suspension; Place 4 drops into the left ear 2 (two) times daily.  Dispense: 7.5 mL; Refill: 0  2. Impacted cerumen of right ear  - carbamide peroxide (DEBROX) 6.5 % OTIC solution; Place 5 drops into the right ear 2 (two) times daily.  Dispense: 15 mL; Refill: 0  3. Constipation, unspecified constipation type  - polyethylene glycol powder (GLYCOLAX /MIRALAX ) 17 GM/SCOOP powder; Take 17 g by mouth daily as needed for moderate constipation. Dissolve 1 capful (17g) in 4-8 ounces of liquid and take by mouth daily.  Dispense: 3350 g; Refill: 1   Follow Up Instructions: I discussed the assessment and treatment plan with the patient. The patient was provided an opportunity to ask questions and all were answered. The patient agreed with the plan and demonstrated an understanding of the instructions.  A copy of instructions were sent to the patient via MyChart unless otherwise noted below.    The patient was advised to call back or seek an in-person evaluation if the symptoms worsen or if the condition fails to improve as anticipated.    [name], FNP  **Disclaimer: This note may have been dictated with voice recognition software. Similar sounding words can inadvertently be transcribed and this note may contain transcription errors which may not have been corrected upon publication of note.**

## 2024-06-15 NOTE — Congregational Nurse Program (Signed)
  Dept: [phone]   Congregational Nurse Program Note  Date of Encounter: 06/15/2024 Saw client at [facility] for follow up with virtual care .CLient states completed antibiotics which has caused constipation issues. Education provided for constipation issues. Encouraged drinking more water. States has discomfort in her left ear. Connected her to virtual care provider.  Past Medical History: Past Medical History:  Diagnosis Date   Seizure [facility])     Encounter Details:

## 2024-06-15 NOTE — Patient Instructions (Addendum)
 Debrox is for wax removal to right ear   Antibiotic (Ciprodex) for left ear infection   Miralax is for constipation mix with a cup of water or juice 1-2 times daily until relieved

## 2024-06-28 ENCOUNTER — Other Ambulatory Visit: Payer: Self-pay

## 2024-06-28 ENCOUNTER — Telehealth: Admitting: Nurse Practitioner

## 2024-06-28 DIAGNOSIS — K047 Periapical abscess without sinus: Secondary | ICD-10-CM

## 2024-06-28 MED ORDER — IBUPROFEN 600 MG PO TABS: 600.0000 mg | ORAL_TABLET | Freq: Three times a day (TID) | ORAL | 0 refills | Status: DC | PRN

## 2024-06-28 MED ORDER — CEPHALEXIN 500 MG PO CAPS: 500.0000 mg | ORAL_CAPSULE | Freq: Four times a day (QID) | ORAL | 0 refills | Status: AC

## 2024-06-28 NOTE — Congregational Nurse Program (Signed)
  Dept: [phone]   Congregational Nurse Program Note  Date of Encounter: 06/28/2024 Client to [facility] with reports of tooth pain, this is the same area that was bothering her previously. She was prescribed antibiotics in October at her dentist appointment, she is currently waiting for an appointment with an oral surgeon on 12/12. RN contacted [name] NP for assistance. She was able to contact the client and a virtual visit was completed. Client was prescribed keflex and will pick up the medication form [facility] on [address]. RN to continue to follow and provide assistance as needed. MARLA Marina BSN, RN Past Medical History: Past Medical History:  Diagnosis Date   Seizure [facility])     Encounter Details:  Community Questionnaire - 06/28/24 0900       Questionnaire   Ask client: Do you give verbal consent for me to treat you today? Yes    Student Assistance N/A    Location Patient Served  [facility]    Encounter Setting CN site    Population Status Unknown    Insurance Medicaid    Insurance/Financial Assistance Referral N/A    Medication N/A    Medical Provider Yes    Screening Referrals Made N/A    Medical Referrals Made Dental    Medical Appointment Completed N/A    CNP Interventions Advocate/Support;Navigate Healthcare System    Screenings CN Performed N/A    ED Visit Averted N/A    Life-Saving Intervention Made N/A

## 2024-06-28 NOTE — Progress Notes (Signed)
 Acute Video Visit    Virtual Visit Consent:   Kendra Potts, you are scheduled for a virtual visit with a [facility] provider today.     Just as with appointments in the office, your consent must be obtained to participate.  Your consent will be active for this visit and any virtual visit you may have with one of our providers in the next 365 days.     If you have a MyChart account, a copy of this consent can be sent to you electronically.  All virtual visits are billed to your insurance company just like a traditional visit in the office.    If the connection with a video visit is poor, the visit may have to be switched to a telephone visit.  With either a video or telephone visit, we are not always able to ensure that we have a secure connection.     I need to obtain your verbal consent now.   Are you willing to proceed with your visit today?    Kendra Potts has provided verbal consent on 06/28/2024 for a virtual visit ( telephone).   Kendra Kitty, FNP  Date: 06/28/2024 9:45 AM  Subjective:     Patient ID: Kendra Potts, female    DOB: Mar 11, 1986, 38 y.o.   MRN: [id number]  Kendra Potts Kendra Potts, connected with  Kendra Potts  ([id number], 08-13-1985) on 06/28/24 at 10:00 AM EST by a video-enabled telemedicine application and verified that I am speaking with the correct person using two identifiers.   Location: Patient: Home  Provider: Virtual Visit Location Provider: Home Office   I discussed the limitations of evaluation and management by telemedicine and the availability of in person appointments. The patient expressed understanding and agreed to proceed.     HPI Kendra Potts is a 38 y.o. who identifies as a female who was assigned female at birth, and is being seen today for recurrent dental pain. She feels that her face is swollen today, denies body aches or fever.   She started to notice this in the past few days.   Was previously on Amoxicillin  500 prescribed  05/27/24 for ten days by her dentist   She did experience GI upset/constipation from the antibiotics  She is scheduled to see a dentist 07/08/24  Her constipation has improved, he did use Miralax  for assistance.   Denies any other systemic symptoms today  She is working with Kendra Potts to arrange future dental appointments and surgeries       Objective:      Physical Exam Neurological:     Mental Status: She is alert and oriented to person, place, and time.  Psychiatric:        Mood and Affect: Mood normal.         Assessment & Plan:   1. Dental abscess  Meds ordered this encounter  Medications   ibuprofen  (ADVIL ) 600 MG tablet    Sig: Take 1 tablet (600 mg total) by mouth every 8 (eight) hours as needed.    Dispense:  90 tablet    Refill:  0   cephALEXin (KEFLEX) 500 MG capsule    Sig: Take 1 capsule (500 mg total) by mouth 4 (four) times daily for 7 days.    Dispense:  28 capsule    Refill:  0     Follow Up Instructions: I discussed the assessment and treatment plan with the patient. The patient was provided an opportunity to  ask questions and all were answered. The patient agreed with the plan and demonstrated an understanding of the instructions.  A copy of instructions were sent to the patient via MyChart unless otherwise noted below.    The patient was advised to call back or seek an in-person evaluation if the symptoms worsen or if the condition fails to improve as anticipated.    Kendra Kitty, FNP  **Disclaimer: This note may have been dictated with voice recognition software. Similar sounding words can inadvertently be transcribed and this note may contain transcription errors which may not have been corrected upon publication of note.**

## 2024-06-29 NOTE — Congregational Nurse Program (Signed)
  Dept: [phone]   Congregational Nurse Program Note  Date of Encounter: 06/29/2024 Client to [facility], nurse led clinic, with complaints of  severe tooth and right jaw pain with swelling. She had been seen previously at [facility], the pain and infection are related to a wisdom tooth that needs to be extracted. She was prescribed antibiotics at that visit on 10/31 and referred to an oral surgeon in [place]. She was prescribed keflex yesterday by her PCP and has taken 2 doses as of this visit. RN contacted [facility] in [place] and an appointment was given for 12/19 at 1:45. Records and referral sent from [facility] as requested. RN was advised that client will be seen for a consultation, that the tooth must be free from infection prior to having any extractions. Client made aware. Medicaid transportation arranged. MARLA Marina BSN, RN Past Medical History: Past Medical History:  Diagnosis Date   Seizure [facility])     Encounter Details:  Community Questionnaire - 06/29/24 1230       Questionnaire   Ask client: Do you give verbal consent for me to treat you today? Yes    Student Assistance N/A    Location Patient Served  [facility]    Encounter Setting CN site    Population Status Unknown    Insurance Medicaid    Insurance/Financial Assistance Referral N/A    Medication N/A    Medical Provider Yes    Screening Referrals Made N/A    Medical Referrals Made Dental    Medical Appointment Completed N/A    CNP Interventions Advocate/Support;Navigate Healthcare System;Educate    Screenings CN Performed N/A    ED Visit Averted N/A    Life-Saving Intervention Made N/A

## 2024-07-06 ENCOUNTER — Telehealth: Payer: Self-pay | Admitting: Nurse Practitioner

## 2024-07-06 ENCOUNTER — Ambulatory Visit: Admitting: Internal Medicine

## 2024-07-06 ENCOUNTER — Encounter: Payer: Self-pay | Admitting: Internal Medicine

## 2024-07-06 VITALS — BP 100/60 | HR 79 | Temp 99.0°F | Ht 62.0 in | Wt 160.8 lb

## 2024-07-06 DIAGNOSIS — Z720 Tobacco use: Secondary | ICD-10-CM

## 2024-07-06 DIAGNOSIS — F17210 Nicotine dependence, cigarettes, uncomplicated: Secondary | ICD-10-CM | POA: Diagnosis not present

## 2024-07-06 DIAGNOSIS — R0683 Snoring: Secondary | ICD-10-CM | POA: Diagnosis not present

## 2024-07-06 NOTE — Telephone Encounter (Signed)
 Spoke with patient regarding paperwork for housing will complete today and email to caseworker at [employer]

## 2024-07-06 NOTE — Progress Notes (Signed)
 Name: Kendra Potts MRN: [id number] DOB: 09/04/1985    CHIEF COMPLAINT:  ASSESSMENT OF SLEEP APNEA EXCESSIVE DAYTIME SLEEPINESS Extensive tobacco abuse   HISTORY OF PRESENT ILLNESS: Patient is seen today for problems and issues with sleep related to excessive daytime sleepiness Patient  has been having sleep problems for many years Patient has been having excessive daytime sleepiness for a long time Patient has been having extreme fatigue and tiredness, lack of energy +  very Loud snoring every night + struggling breathe at night and gasps for air  Discussed sleep data and reviewed with patient.  Encouraged proper weight management.  Discussed driving precautions and its relationship with hypersomnolence.  Discussed operating dangerous equipment and its relationship with hypersomnolence.  Discussed sleep hygiene, and benefits of a fixed sleep waked time.  The importance of getting eight or more hours of sleep discussed with patient.  Discussed limiting the use of the computer and television before bedtime.  Decrease naps during the day, so night time sleep will become enhanced.  Limit caffeine, and sleep deprivation.  HTN, stroke, and heart failure are potential risk factors.      07/06/2024   10:00 AM  Results of the Epworth flowsheet  Sitting and reading 3  Watching TV 3  Sitting, inactive in a public place (e.g. a theatre or a meeting) 0  As a passenger in a car for an hour without a break 0  Lying down to rest in the afternoon when circumstances permit 0  Sitting and talking to someone 0  Sitting quietly after a lunch without alcohol 0  In a car, while stopped for a few minutes in traffic 0  Total score 6   Patient with intermittent cough wheezing Patient has extensive smoking history Smokes cigarettes and marijuana Smoking cessation strongly advised No significant respiratory compromise   PAST MEDICAL HISTORY :   has a past medical history of Seizure  (HCC).  has a past surgical history that includes External ear surgery. Prior to Admission medications   Medication Sig Start Date End Date Taking? Authorizing Provider  albuterol  (VENTOLIN  HFA) 108 (90 Base) MCG/ACT inhaler Inhale 2 puffs into the lungs every 6 (six) hours as needed for wheezing or shortness of breath. 06/01/24   Kennyth Domino, FNP  carbamide peroxide (DEBROX) 6.5 % OTIC solution Place 5 drops into the right ear 2 (two) times daily. 06/15/24   Kennyth Domino, FNP  ciprofloxacin -dexamethasone  (CIPRODEX ) OTIC suspension Place 4 drops into the left ear 2 (two) times daily. 06/15/24   Kennyth Domino, FNP  ibuprofen  (ADVIL ) 600 MG tablet Take 1 tablet (600 mg total) by mouth every 8 (eight) hours as needed. 06/28/24   Kennyth Domino, FNP  nicotine  polacrilex (NICORETTE ) 4 MG lozenge Take 1 lozenge (4 mg total) by mouth as needed for smoking cessation. 06/01/24   Kennyth Domino, FNP  polyethylene glycol powder (GLYCOLAX /MIRALAX ) 17 GM/SCOOP powder Take 17 g by mouth daily as needed for moderate constipation. Dissolve 1 capful (17g) in 4-8 ounces of liquid and take by mouth daily. 06/15/24   Kennyth Domino, FNP  Spacer/Aero-Holding Raguel DEVI Take 1 Container by mouth every 4 (four) hours as needed (to use with Albuterol  inhaler). 06/01/24   Kennyth Domino, FNP  Vitamin D , Ergocalciferol , (DRISDOL ) 1.25 MG (50000 UNIT) CAPS capsule Take 1 capsule (50,000 Units total) by mouth every 7 (seven) days. 06/06/24   Kennyth Domino, FNP   Allergies  Allergen Reactions   Percocet [Oxycodone -Acetaminophen ]     FAMILY HISTORY:  family history includes Breast cancer in her mother. SOCIAL HISTORY:  reports that she has been smoking cigarettes. She has never used smokeless tobacco. She reports current alcohol use. She reports current drug use. Drug: Marijuana.   BP 100/60   Pulse 79   Temp 99 F (37.2 C)   Ht 5' 2 (1.575 m)   Wt 160 lb 12.8 oz (72.9 kg) Comment: fully clothed in heavy winter gear   LMP 05/28/2024   SpO2 99%   BMI 29.41 kg/m      Review of Systems: Gen:  Denies  fever, sweats, chills weight loss  HEENT: Denies blurred vision, double vision, ear pain, eye pain, hearing loss, nose bleeds, sore throat Cardiac:  No dizziness, chest pain or heaviness, chest tightness,edema, No JVD Resp: +cough, -sputum production, -shortness of breath,-wheezing, -hemoptysis,  Other:  All other systems negative   Physical Examination:   General Appearance: No distress  EYES PERRLA, EOM intact.   NECK Supple, No JVD Pulmonary: normal breath sounds, No wheezing.  CardiovascularNormal S1,S2.  No m/r/g.   Abdomen: Benign, Soft, non-tender. Neurology UE/LE 5/5 strength, no focal deficits Ext pulses intact, cap refill intact ALL OTHER ROS ARE NEGATIVE     ASSESSMENT AND PLAN SYNOPSIS  Patient with signs and symptoms of excessive daytime sleepiness with probable underlying diagnosis of obstructive sleep apnea in the setting  deconditioned state, extensive smoking history with signs symptoms of reactive airway disease and asthma which can progress to COPD   Recommend Sleep Study for definitve diagnosis  Extensive tobacco abuse Smoking Assessment and Cessation Counseling Upon further questioning, Patient smokes 1.5 pack/day I have advised patient to quit/stop smoking as soon as possible due to high risk for multiple medical problems Patient is NOT willing to quit smoking I have advised patient that we can assist and have options of Nicotine  replacement therapy. I also advised patient on behavioral therapy and can provide oral medication therapy in conjunction with the other therapies Follow up next Office visit  for assessment of smoking cessation Smoking cessation counseling advised for 15 minutes   Deconditioned state -Recommend increased daily activity and exercise   MEDICATION ADJUSTMENTS/LABS AND TESTS ORDERED: Recommend Sleep Study Recommend smoking  cessation   CURRENT MEDICATIONS REVIEWED AT LENGTH WITH PATIENT TODAY   Patient  satisfied with Plan of action and management. All questions answered   Follow up 3 months   I spent a total of 50 minutes dedicated to the care of this patient on the date of this encounter to include pre-visit review of records, face-to-face time with the patient discussing conditions above, post visit ordering of testing, clinical documentation with the electronic health record, making appropriate referrals as documented, and communicating necessary information to the patient's healthcare team.     Nickolas Alm Cellar, M.D.  [facility] Pulmonary & Critical Care Medicine  Medical Director [facility] [place]

## 2024-07-06 NOTE — Patient Instructions (Signed)
 Please stop smoking Recommend sleep study to assess for sleep apnea Continue albuterol  as needed

## 2024-07-13 ENCOUNTER — Ambulatory Visit

## 2024-07-18 ENCOUNTER — Telehealth: Payer: Self-pay | Admitting: Internal Medicine

## 2024-07-18 DIAGNOSIS — G4733 Obstructive sleep apnea (adult) (pediatric): Secondary | ICD-10-CM

## 2024-07-18 DIAGNOSIS — R0683 Snoring: Secondary | ICD-10-CM

## 2024-07-18 NOTE — Telephone Encounter (Signed)
 Dr. Isaiah placed an order for split night sleep study on 07/06/24. The order was sent to [facility]. We received a note from the [employer] they have denied in lab sleep study.

## 2024-08-01 ENCOUNTER — Telehealth: Payer: Self-pay | Admitting: Nurse Practitioner

## 2024-08-01 DIAGNOSIS — Z7189 Other specified counseling: Secondary | ICD-10-CM

## 2024-08-01 DIAGNOSIS — R110 Nausea: Secondary | ICD-10-CM

## 2024-08-01 DIAGNOSIS — Z87898 Personal history of other specified conditions: Secondary | ICD-10-CM

## 2024-08-01 NOTE — Telephone Encounter (Signed)
 Follow up with patient regarding recent appointments.  She was told her insurance would not cover a sleep study at a facility- she would need to have a sleep study at home.   She has been able to get her teeth removed and is feeling much better from that perspective.   She admits that she has been having some difficulty recently since New Years regarding relapsing with alcohol, she feels that this is the only thing that helps her sleep. She has also missed work and is concerned about that. She also lost a friend and today is the funeral and she is very upset. She would like assistance with counseling, and assistance in stopping alcohol. She is not interested in [place].   Today she is feeling alone, denies SI/HI  Also expressed need for a different job.  She is connected with [employer].   Will place referral for [facility] encouraged to come to office Wednesday and to follow up with [facility] as well.

## 2024-08-01 NOTE — Progress Notes (Unsigned)
 The patient completed a virtual primary care appt on 06/15/2024, where her blood pressure was measured at 127/85 mmHg. During the appt, the patient reported that she does smoke, has insurance, is established with a primary care provider, and does not have any SDOH needs indicated at the appt.   A chart review indicates [name], FNP - [place] [facility] at [facility] as her PCP. The patients most recent office visit was on 06/28/2024 and she does not have any future appts indicated at this time. Chart review indicates that she has Medicaid and MedPay for her insurance. CHW did a RTE on pt and her insurance is still the same after checking. She is a smoker and has a housing SDOH need indicated according to chart review. Pt is currently receiving support from [facility] relating to   Call Attempt #1: Pt shared she came across a church that supported her with resources to support with housing and her other SDOH needs. She shared she is no longer homeless. Pt asked could she schedule a f/u appt with her PCP to discuss her sleep study being denied and other concerns. Pt also shares she is requesting support for mental health services due to grief as well. She owuld like them mailed and sent via email. CHW obtained email address and updated it in her chart.  CHW in basket Congregational Nurse Darice Marina, RN at [facility] and Alan Piety, RN about pt f/u status and messaged [name], NP about appt f/u, referral for grief counseling support through [facility], and sleep study denial concerns. CHW sent letter and an email to pt with smoking cessation, mental health resources, and [facility] resources in case needed by the pt.  08/02/2024: Referral to [facility] was initiated by PCP and [facility] completed an outreach attempt with the pt. They were not able to reach her at this time and will f/u again.   No additional Health  equity team support indicated at this time.

## 2024-08-01 NOTE — Telephone Encounter (Signed)
 Spoke with pt to notify her that a home sleep study has been ordered and described the process of what is generally involved with getting this testing. Pt expressed understanding and was grateful for the update. NFN.

## 2024-08-02 ENCOUNTER — Telehealth: Payer: Self-pay | Admitting: *Deleted

## 2024-08-02 NOTE — Progress Notes (Signed)
 Complex Care Management Note Care Guide Note  08/02/2024 Name: Kendra Potts MRN: [id number] DOB: 05/22/1986   Complex Care Management Outreach Attempts: An unsuccessful telephone outreach was attempted today to offer the patient information about available complex care management services.  Follow Up Plan:  Additional outreach attempts will be made to offer the patient complex care management information and services.   Encounter Outcome:  No Answer  Harlene Satterfield  [facility]  [facility], [facility] Guide  Direct Dial: [phone]  Fax [phone]

## 2024-08-03 MED ORDER — ONDANSETRON 4 MG PO TBDP: 4.0000 mg | ORAL_TABLET | Freq: Three times a day (TID) | ORAL | 0 refills | Status: AC | PRN

## 2024-08-03 NOTE — Telephone Encounter (Signed)
 Called for follow up today and patient states she developed a fever, N/V two days ago after exposure to the flu. Has been out of work and is requesting a work note. Does not have a ride to clinic today and feels too unwell to take the bus.   Will follow up by end of week and alert CCN at [facility] hope for check in as well.   Meds ordered this encounter  Medications   ondansetron  (ZOFRAN -ODT) 4 MG disintegrating tablet    Sig: Take 1 tablet (4 mg total) by mouth every 8 (eight) hours as needed for nausea.    Dispense:  30 tablet    Refill:  0

## 2024-08-03 NOTE — Progress Notes (Signed)
 Complex Care Management Note Care Guide Note  08/03/2024 Name: SHANIEKA BLEA MRN: [id number] DOB: 1985/09/12   Complex Care Management Outreach Attempts: A second unsuccessful outreach was attempted today to offer the patient with information about available complex care management services.  Follow Up Plan:  Additional outreach attempts will be made to offer the patient complex care management information and services.   Encounter Outcome:  No Answer  Harlene Satterfield  [facility]  [facility], [facility] Guide  Direct Dial: [phone]  Fax [phone]

## 2024-08-04 NOTE — Progress Notes (Signed)
 Complex Care Management Note  Care Guide Note 08/04/2024 Name: Kendra Potts MRN: [id number] DOB: 1986-06-27  [name] is a 39 y.o. year old female who sees Kennyth Domino, FNP for primary care. I reached out to [name] by phone today to offer complex care management services.  Ms. Manninen was given information about Complex Care Management services today including:   The Complex Care Management services include support from the care team which includes your Nurse Care Manager, Clinical Social Worker, or Pharmacist.  The Complex Care Management team is here to help remove barriers to the health concerns and goals most important to you. Complex Care Management services are voluntary, and the patient may decline or stop services at any time by request to their care team member.   Complex Care Management Consent Status: Patient agreed to services and verbal consent obtained.   Follow up plan:  Telephone appointment with complex care management team member scheduled for:  08/10/23  Encounter Outcome:  Patient Scheduled  Harlene Satterfield  [facility]  [facility], [facility] Guide  Direct Dial: [phone]  Fax [phone]

## 2024-08-09 ENCOUNTER — Telehealth: Payer: Self-pay

## 2024-08-10 ENCOUNTER — Emergency Department

## 2024-08-10 ENCOUNTER — Emergency Department
Admission: EM | Admit: 2024-08-10 | Discharge: 2024-08-10 | Disposition: A | Discharge status: Home / Self Care | Attending: Emergency Medicine | Admitting: Emergency Medicine

## 2024-08-10 ENCOUNTER — Other Ambulatory Visit: Payer: Self-pay

## 2024-08-10 DIAGNOSIS — M7989 Other specified soft tissue disorders: Secondary | ICD-10-CM | POA: Insufficient documentation

## 2024-08-10 DIAGNOSIS — Y990 Civilian activity done for income or pay: Secondary | ICD-10-CM | POA: Insufficient documentation

## 2024-08-10 DIAGNOSIS — S99922A Unspecified injury of left foot, initial encounter: Secondary | ICD-10-CM | POA: Diagnosis present

## 2024-08-10 DIAGNOSIS — W208XXA Other cause of strike by thrown, projected or falling object, initial encounter: Secondary | ICD-10-CM | POA: Diagnosis not present

## 2024-08-10 DIAGNOSIS — S9032XA Contusion of left foot, initial encounter: Secondary | ICD-10-CM | POA: Diagnosis not present

## 2024-08-10 NOTE — ED Notes (Signed)
Patient verbalizes understanding of discharge instructions. Opportunity for questioning and answers were provided. Armband removed by staff, pt discharged from ED. Ambulated out to lobby, awaiting ride home

## 2024-08-10 NOTE — ED Triage Notes (Signed)
 Pt to ED via EMS from home, pt reports 2 days ago she was working at the [employer] and they were moving the grill and it fell on her right foot. Pt c/o pain and swelling to foot.

## 2024-08-10 NOTE — ED Provider Notes (Signed)
 "  [facility] Provider Note    Event Date/Time   First MD Initiated Contact with Patient 08/10/24 2206     (approximate)   History   Chief Complaint Foot Pain   HPI  Kendra Potts is a 39 y.o. female with past medical history of seizures who presents to the ED complaining of foot pain.  Patient reports that she was at work moving an oven 2 days ago when she accidentally dropped the corner of the oven on her left foot.  She reports severe pain to the top of her foot since then which is worse when she tries to walk.  She denies any wounds to the foot and has not had any pain around her ankle.     Physical Exam   Triage Vital Signs: ED Triage Vitals  Encounter Vitals Group     BP 08/10/24 2031 (!) 148/109     Girls Systolic BP Percentile --      Girls Diastolic BP Percentile --      Boys Systolic BP Percentile --      Boys Diastolic BP Percentile --      Pulse Rate 08/10/24 2031 73     Resp 08/10/24 2031 20     Temp 08/10/24 2031 98.2 F (36.8 C)     Temp Source 08/10/24 2031 Oral     SpO2 08/10/24 2031 99 %     Weight 08/10/24 2033 140 lb (63.5 kg)     Height 08/10/24 2033 5' 2 (1.575 m)     Head Circumference --      Peak Flow --      Pain Score 08/10/24 2033 10     Pain Loc --      Pain Education --      Exclude from Growth Chart --     Most recent vital signs: Vitals:   08/10/24 2031 08/10/24 2033  BP: (!) 148/109 (!) 148/97  Pulse: 73   Resp: 20   Temp: 98.2 F (36.8 C)   SpO2: 99%     Constitutional: Alert and oriented. Eyes: Conjunctivae are normal. Head: Atraumatic. Nose: No congestion/rhinnorhea. Mouth/Throat: Mucous membranes are moist.  Cardiovascular: Normal rate, regular rhythm. Grossly normal heart sounds.  2+ radial and DP pulses bilaterally. Respiratory: Normal respiratory effort.  No retractions. Lungs CTAB. Gastrointestinal: Soft and nontender. No distention. Musculoskeletal: Diffuse tenderness to palpation of  the dorsum of the left foot with no obvious deformity. Neurologic:  Normal speech and language. No gross focal neurologic deficits are appreciated.    ED Results / Procedures / Treatments   Labs (all labs ordered are listed, but only abnormal results are displayed) Labs Reviewed - No data to display   RADIOLOGY Left foot x-ray reviewed and interpreted by me with no fracture or dislocation.  PROCEDURES:  Critical Care performed: No  Procedures   MEDICATIONS ORDERED IN ED: Medications - No data to display   IMPRESSION / MDM / ASSESSMENT AND PLAN / ED COURSE  I reviewed the triage vital signs and the nursing notes.                              39 y.o. female with past medical history of seizures who presents to the ED complaining of left foot pain after she dropped an oven on top of it.  Patient's presentation is most consistent with acute complicated illness / injury requiring diagnostic workup.  Differential diagnosis includes, but is not limited to, fracture, dislocation, contusion.  Patient nontoxic-appearing and in no acute distress, vital signs are unremarkable.  No wounds noted to her foot and patient is neurovascularly intact.  X-ray imaging is unremarkable, patient declined Toradol  for pain.  She is appropriate for discharge home, will place in a boot for comfort.  She was counseled to follow-up with podiatry as needed and to return to the ED for new or worsening symptoms.  Patient agrees with plan.      FINAL CLINICAL IMPRESSION(S) / ED DIAGNOSES   Final diagnoses:  Contusion of left foot, initial encounter     Rx / DC Orders   ED Discharge Orders     None        Note:  This document was prepared using Dragon voice recognition software and may include unintentional dictation errors.   Willo Dunnings, MD 08/10/24 2346  "

## 2024-08-10 NOTE — ED Triage Notes (Signed)
 Pt arrives via EMS from home for c/o left foot/ankle pain/swelling with no known injury

## 2024-08-16 ENCOUNTER — Other Ambulatory Visit: Payer: Self-pay

## 2024-08-17 ENCOUNTER — Other Ambulatory Visit: Payer: Self-pay

## 2024-08-17 ENCOUNTER — Encounter: Payer: Self-pay | Admitting: Nurse Practitioner

## 2024-08-17 ENCOUNTER — Telehealth: Admitting: Nurse Practitioner

## 2024-08-17 VITALS — BP 121/81 | HR 76 | Resp 16 | Ht 62.0 in | Wt 150.0 lb

## 2024-08-17 DIAGNOSIS — S9032XD Contusion of left foot, subsequent encounter: Secondary | ICD-10-CM | POA: Diagnosis not present

## 2024-08-17 DIAGNOSIS — B351 Tinea unguium: Secondary | ICD-10-CM

## 2024-08-17 DIAGNOSIS — M25472 Effusion, left ankle: Secondary | ICD-10-CM | POA: Diagnosis not present

## 2024-08-17 DIAGNOSIS — H6092 Unspecified otitis externa, left ear: Secondary | ICD-10-CM

## 2024-08-17 MED ORDER — IBUPROFEN 800 MG PO TABS: 800.0000 mg | ORAL_TABLET | Freq: Three times a day (TID) | ORAL | 0 refills | Status: AC | PRN

## 2024-08-17 MED ORDER — TERBINAFINE HCL 250 MG PO TABS
250.0000 mg | ORAL_TABLET | Freq: Every day | ORAL | 2 refills | Status: AC
  Filled 2024-08-17: qty 30, 30d supply, fill #0

## 2024-08-17 NOTE — Progress Notes (Signed)
 "  Established Patient Office Visit  Virtual Visit Consent:   Kendra Potts, you are scheduled for a virtual visit with a [facility] provider today.     Just as with appointments in the office, your consent must be obtained to participate.  Your consent will be active for this visit and any virtual visit you may have with one of our providers in the next 365 days.     If you have a MyChart account, a copy of this consent can be sent to you electronically.  All virtual visits are billed to your insurance company just like a traditional visit in the office.    As this is a virtual visit, video technology does not allow for your provider to perform a traditional examination.  This may limit your provider's ability to fully assess your condition.  If your provider identifies any concerns that need to be evaluated in person or the need to arrange testing (such as labs, EKG, etc.), we will make arrangements to do so.     Although advances in technology are sophisticated, we cannot ensure that it will always work on either your end or our end.  If the connection with a video visit is poor, the visit may have to be switched to a telephone visit.  With either a video or telephone visit, we are not always able to ensure that we have a secure connection.     I need to obtain your verbal consent now.   Are you willing to proceed with your visit today?    Kendra Potts has provided verbal consent on 08/17/2024 for a virtual visit (video or telephone).   Kendra Kitty, FNP  Date: 08/17/2024 4:52 PM  SUBJECTIVE  Patient ID: Kendra Potts, female    DOB: 06-Mar-1986  Age: 39 y.o. MRN: [id number]  Kendra Potts Kendra Potts, connected with  Kendra Potts  ([id number], [birth year]) on 08/17/24 at 11:00 AM EST by a video-enabled telemedicine application and verified that I am speaking with the correct person using two identifiers.  Telepresenter, Berlinda Pouch, present for entirety of visit to assist with video  functionality and physical examination via [employer] device.   Location: Patient: [facility]  Provider: Virtual Visit Location Provider: Home Office   I discussed the limitations of evaluation and management by telemedicine and the availability of in person appointments. The patient expressed understanding and agreed to proceed.      Chief Complaint  Patient presents with   Hospitalization Follow-up    HPI  Kendra Potts is a 39 y.o. who identifies as a female who was assigned female at birth, and is being seen today for follow up. Since her last visit she was in an accident at work. An oven fell onto her foot. She was taken to ED and XRAY of her foot was unremarkable but she was placed in a tall boot (left foot). This episode was 08/10/24. She was not given a follow up appointment or a referral. She has continued to wear the boot since that time and has been experiencing increased pain in her left ankle with swelling.    She would like a referral to an eye doctor, she has been experiencing floaters at times that she notices more when she is playing video games/ during screen time.    Her left ear continues bother her, she has been using the Rx drops but continues to notice an odor and feels when she chews she can hear a  crunching sounds inside. Denies fever/facial swelling.    She also notes that she has darkening of her toe nails and is concerned about that.   Diet: Skips breakfast  Likes noodles/chips/pizza  mainly microwave foods for lunch  Drinks Pepsi and sweet tea -   She continues her weekly Vitamin D  supplement     Review of Systems  Constitutional: Negative.   HENT:  Positive for ear discharge.   Respiratory: Negative.    Cardiovascular: Negative.   Gastrointestinal: Negative.   Genitourinary: Negative.   Skin: Negative.       Objective:     BP 121/81 (BP Location: Left Arm, Patient Position: Sitting, Cuff Size: Normal)   Pulse 76   Resp 16   Ht 5' 2  (1.575 m)   Wt 150 lb (68 kg)   SpO2 96%   BMI 27.44 kg/m  BP Readings from Last 3 Encounters:  08/17/24 121/81  08/10/24 (!) 148/97  07/06/24 100/60      Physical Exam Constitutional:      General: She is not in acute distress.    Appearance: Normal appearance. She is not ill-appearing.  HENT:     Ears:     Comments: Left ear with surgical repair, canal filled with cerumen and mucosal drainage. Non tender on exam- no erythema noted     Nose: Nose normal.     Mouth/Throat:     Mouth: Mucous membranes are moist.  Cardiovascular:     Rate and Rhythm: Regular rhythm.  Pulmonary:     Effort: Pulmonary effort is normal.  Musculoskeletal:     Cervical back: Neck supple.     Left ankle: Swelling present. Tenderness present over the lateral malleolus. Decreased range of motion.       Legs:     Comments: Increased pain with flexion of foot, no pain with extension, pain with light palpation of left lateral ankle, limited ability to perform ROM.   Neurological:     Mental Status: She is alert.       Last CBC Lab Results  Component Value Date   WBC 5.4 06/01/2024   HGB 15.0 06/01/2024   HCT 44.9 06/01/2024   MCV 92 06/01/2024   MCH 30.6 06/01/2024   RDW 13.1 06/01/2024   PLT 325 06/01/2024   Last metabolic panel Lab Results  Component Value Date   GLUCOSE 79 06/01/2024   NA 137 06/01/2024   K 4.5 06/01/2024   CL 103 06/01/2024   CO2 20 06/01/2024   BUN 11 06/01/2024   CREATININE 0.60 06/01/2024   EGFR 119 06/01/2024   CALCIUM 9.3 06/01/2024   PROT 6.9 06/01/2024   ALBUMIN 4.5 06/01/2024   LABGLOB 2.4 06/01/2024   BILITOT 0.3 06/01/2024   ALKPHOS 76 06/01/2024   AST 23 06/01/2024   ALT 16 06/01/2024   ANIONGAP 9 10/14/2021   Last lipids Lab Results  Component Value Date   CHOL 188 06/01/2024   HDL 68 06/01/2024   LDLCALC 109 (H) 06/01/2024   TRIG 59 06/01/2024   CHOLHDL 2.8 06/01/2024   Last hemoglobin A1c Lab Results  Component Value Date    HGBA1C 5.7 (H) 06/01/2024   Last thyroid functions Lab Results  Component Value Date   TSH 0.305 (L) 06/01/2024   TSH 0.315 (L) 06/01/2024   FREET4 1.31 06/01/2024   Last vitamin D  Lab Results  Component Value Date   VD25OH 8.9 (L) 06/01/2024   Last vitamin B12 and Folate No results found for:  VITAMINB12, FOLATE    The ASCVD Risk score (Arnett DK, et al., 2019) failed to calculate for the following reasons:   The 2019 ASCVD risk score is only valid for ages 34 to 53    Assessment & Plan:   1. Left ankle swelling (Primary)  - DG Ankle Complete Left; Future - ibuprofen  (ADVIL ) 800 MG tablet; Take 1 tablet (800 mg total) by mouth every 8 (eight) hours as needed.  Dispense: 90 tablet; Refill: 0  2. Contusion of left foot, subsequent encounter  3. Recurrent otitis externa of left ear  - Ambulatory referral to ENT  4. Toenail fungus  - terbinafine  (LAMISIL ) 250 MG tablet; Take 1 tablet (250 mg total) by mouth daily.  Dispense: 30 tablet; Refill: 2   Follow up 2/12 for repeat labs A1C and Thyroid and Vitamin D    Reviewed labs, discussed low carbohydrate diet and reducing sugar intake in drinks specifically.   Follow Up Instructions:  I discussed the assessment and treatment plan with the patient. The patient was provided an opportunity to ask questions and all were answered. The patient agreed with the plan and demonstrated an understanding of the instructions.  A copy of instructions were sent to the patient via MyChart unless otherwise noted below.    The patient was advised to call back or seek an in-person evaluation if the symptoms worsen or if the condition fails to improve as anticipated.   Kendra Kitty, FNP  **Disclaimer: This note may have been dictated with voice recognition software. Similar sounding words can inadvertently be transcribed and this note may contain transcription errors which may not have been corrected upon publication of note.** "

## 2024-08-17 NOTE — Progress Notes (Signed)
.  Pt presents for hospital follow-up  -seen at [facility] on 08/10/24 for left foot contusion  -pain level at 7, request dosage of Ibuprofen  to be switched from 600mg -800mg

## 2024-08-17 NOTE — Patient Instructions (Addendum)
 Eye doctor options:  [facility]  [address] [place], [place] [address] [phone]  Or   [facility] [address] [place], [place] [address] [phone]  Repeat labs next month (vit D and thyroid) February 12th at [facility] 10am   Appointment tomorrow at 11am at [facility]  Thursday, January 22nd 2026 at 11:00 am Provider: Beryl Pizza PA-C Location: [facility] [address] , [place], [place], [address] Call to reschedule: [phone]

## 2024-08-17 NOTE — Congregational Nurse Program (Signed)
" °  Dept: [phone]   Congregational Nurse Program Note  Date of Encounter: 08/17/2024  Contacted Darice at [facility] to arrange ride to [facility] for appointment tomorrow, Past Medical History: Past Medical History:  Diagnosis Date   Seizure [facility])     Encounter Details:     "

## 2024-08-18 ENCOUNTER — Other Ambulatory Visit: Payer: Self-pay

## 2024-08-21 NOTE — Patient Outreach (Signed)
 Complex Care Management   Visit Note  08/16/2024  Name:  Kendra Potts MRN: [id number] DOB: [birth year]  Situation: Referral received for Complex Care Management related to Mental/Behavioral Health diagnosis Depressed mood and Substance Abuse/Misuse alcohol. I obtained verbal consent from Patient.  Visit completed with Patient  on the phone  Background:   Past Medical History:  Diagnosis Date   Seizure [facility])     Assessment:Phone call to complete initial assessment. Assisted Patient with contacting her insurance provider for information on transportation services, value added services (healthy reward/gift card/ food card etc) ; contacted DSS to provide her new address; encouraged her to schedule an appintment asap with provider due to multiple medical concerns particularly for follow up of foot contusion.  Patient Reported Symptoms:  Cognitive Cognitive Status: Alert and oriented to person, place, and time, Able to follow simple commands, Insightful and able to interpret abstract concepts, Normal speech and language skills Cognitive/Intellectual Conditions Management [RPT]: None reported or documented in medical history or problem list   Health Maintenance Behaviors: Stress management Healing Pattern: Average Health Facilitated by: Rest  Neurological Neurological Review of Symptoms: No symptoms reported Neurological Management Strategies: Adequate rest  HEENT HEENT Symptoms Reported: Ear discharge, Nasal discharge      Cardiovascular Cardiovascular Symptoms Reported: Other: Other Cardiovascular Symptoms: off and on tightness in the chest- could it be the smoking? Does patient have uncontrolled Hypertension?: No (she is not sure) Cardiovascular Management Strategies: Adequate rest  Respiratory Respiratory Symptoms Reported: Wheezing Other Respiratory Symptoms: believes this is from smoking Respiratory Management Strategies: Medication therapy  Endocrine Endocrine Symptoms Reported:  No symptoms reported Is patient diabetic?: No    Gastrointestinal Gastrointestinal Symptoms Reported: No symptoms reported      Genitourinary Genitourinary Symptoms Reported: Other Additional Genitourinary Details: pressure on her bladder that makes it feel as if she has to urinate but then very little comes out when uses the bathroom    Integumentary Integumentary Symptoms Reported: Other Other Integumentary Symptoms: left foot - two  toes; right foot 3 toes - are very dark and not sure why    Musculoskeletal Musculoskelatal Symptoms Reviewed: No symptoms reported Musculoskeletal Management Strategies: Adequate rest Musculoskeletal Self-Management Outcome: 2 (bad) Falls in the past year?: No    Psychosocial Psychosocial Symptoms Reported: Sadness - if selected complete PHQ 2-9     Quality of Family Relationships: supportive, helpful, involved Do you feel physically threatened by others?: No    08/21/2024    PHQ2-9 Depression Screening   Little interest or pleasure in doing things Not at all (Prefer to be out and about when not sick)  Feeling down, depressed, or hopeless More than half the days  PHQ-2 - Total Score 2  Trouble falling or staying asleep, or sleeping too much Several days (has trouble falling asleep and will drink to go to sleep)  Feeling tired or having little energy Not at all (Have a lot of energy)  Poor appetite or overeating  More than half the days (If smoking - have more of an apetite ; if not smoking then apetite is somewhat poor)  Feeling bad about yourself - or that you are a failure or have let yourself or your family down Not at all (I feel very good about myself- i am proud of myself because of where i have been- i am doing what my mama would want me to do)  Trouble concentrating on things, such as reading the newspaper or watching television Several days  Moving  or speaking so slowly that other people could have noticed.  Or the opposite - being so  fidgety or restless that you have been moving around a lot more than usual Not at all  Thoughts that you would be better off dead, or hurting yourself in some way Not at all  PHQ2-9 Total Score 6  If you checked off any problems, how difficult have these problems made it for you to do your work, take care of things at home, or get along with other people Not difficult at all  Depression Interventions/Treatment Counseling    There were no vitals filed for this visit. Pain Scale: 0-10  Medications Reviewed Today     Reviewed by Angelena Finis HERO, LCSW (Social Worker) on 08/16/24 at 1134  Med List Status: <None>   Medication Order Taking? Sig Documenting Provider Last Dose Status Informant  albuterol  (VENTOLIN  HFA) 108 (90 Base) MCG/ACT inhaler [id number] Yes Inhale 2 puffs into the lungs every 6 (six) hours as needed for wheezing or shortness of breath. Kennyth Domino, FNP  Active   carbamide peroxide (DEBROX) 6.5 % OTIC solution [id number] Yes Place 5 drops into the right ear 2 (two) times daily. Kennyth Domino, FNP  Active   ciprofloxacin -dexamethasone  (CIPRODEX ) OTIC suspension [id number] Yes Place 4 drops into the left ear 2 (two) times daily. Kennyth Domino, FNP  Active   ibuprofen  (ADVIL ) 600 MG tablet [id number] Yes Take 1 tablet (600 mg total) by mouth every 8 (eight) hours as needed. Kennyth Domino, FNP  Active   nicotine  polacrilex (NICORETTE ) 4 MG lozenge [id number] Yes Take 1 lozenge (4 mg total) by mouth as needed for smoking cessation. Kennyth Domino, FNP  Active   ondansetron  (ZOFRAN -ODT) 4 MG disintegrating tablet [id number]  Take 1 tablet (4 mg total) by mouth every 8 (eight) hours as needed for nausea. Kennyth Domino, FNP  Active   polyethylene glycol powder (GLYCOLAX /MIRALAX ) 17 GM/SCOOP powder [id number] Yes Take 17 g by mouth daily as needed for moderate constipation. Dissolve 1 capful (17g) in 4-8 ounces of liquid and take by mouth daily. Kennyth Domino, FNP  Active    Spacer/Aero-Holding Chambers DEVI [id number]  Take 1 Container by mouth every 4 (four) hours as needed (to use with Albuterol  inhaler). Kennyth Domino, FNP  Active   Vitamin D , Ergocalciferol , (DRISDOL ) 1.25 MG (50000 UNIT) CAPS capsule [id number] Yes Take 1 capsule (50,000 Units total) by mouth every 7 (seven) days. Kennyth Domino, FNP  Active   Med List Note Ted Butler BIRCH, CPhT 08/11/18 0107): Pt does not have a pharmacy             Recommendation:   PCP Follow-up as soon as possible for your foot Specialty provider follow-up as needed Insurance provider follow up for counseling/substance misuse and transportation  Follow Up Plan:   Telephone follow up appointment date/time:  08/29/2024 at 2:00 pm  Murray Angelena, LCSW [facility], [facility] Licensed Clinical Social Worker Direct Dial : [phone]

## 2024-08-21 NOTE — Patient Instructions (Signed)
 Visit Information  Thank you for taking time to visit with me today. Please don't hesitate to contact me if I can be of assistance to you before our next scheduled appointment.  Your next care management appointment is by telephone on 08/29/2024 at 2:00 pm  Please call the care guide team at [phone] if you need to cancel, schedule, or reschedule an appointment.   Please call 1-800-273-TALK (toll free, 24 hour hotline) call 911 Crisis Line [phone] if you are experiencing a Mental Health or Behavioral Health Crisis or need someone to talk to.  Murray Shawl, LCSW [facility], [facility] Licensed Clinical Social Worker Direct Dial : [phone]

## 2024-08-29 ENCOUNTER — Other Ambulatory Visit: Payer: Self-pay

## 2024-09-01 NOTE — Congregational Nurse Program (Signed)
" °  Dept: [phone]   Congregational Nurse Program Note  Date of Encounter: 09/01/2024 Client to [facility]. nurse led clinic, for assistance in setting up transportation for her upcoming appointments next week. Transportation arranged. RN provided education to client on how this was done. Client also needs instruction on how to use/download her Mychart. RN also assisted client with adjusting and reapplying the brace  she was given at [facility] for her right foot. She has an MRI scheduled for 2/12. RN also assisted client in setting up a written planner so she could keep track of her appointments all in one place. Kendra Potts BSN, RN Past Medical History: Past Medical History:  Diagnosis Date   Seizure [facility])     Encounter Details:  Community Questionnaire - 09/01/24 1352       Questionnaire   Ask client: Do you give verbal consent for me to treat you today? Yes    Student Assistance N/A    Location Patient Served  [facility]    Encounter Setting CN site    Population Status Housed    [place]    Insurance/Financial Assistance Referral N/A    Medication N/A    Medical Provider Yes    Medical Referrals Made NA    Medical Appointment Completed N/A    Screenings CN Performed (remember to also record results) NA    CN Interventions Advocate/Support;Case Management;Other Education    ED Visit Averted N/A            "

## 2024-09-05 ENCOUNTER — Telehealth

## 2024-09-07 ENCOUNTER — Ambulatory Visit

## 2024-09-08 ENCOUNTER — Ambulatory Visit: Admitting: Nurse Practitioner

## 2024-10-13 ENCOUNTER — Ambulatory Visit: Admitting: Internal Medicine
